# Patient Record
Sex: Female | Born: 1950 | Race: White | Hispanic: No | Smoking: Former smoker
Health system: Southern US, Community
[De-identification: ages and names within clinical notes are randomized; demographics above are authoritative.]

## PROBLEM LIST (undated history)

## (undated) DIAGNOSIS — H9190 Unspecified hearing loss, unspecified ear: Secondary | ICD-10-CM

## (undated) DIAGNOSIS — C801 Malignant (primary) neoplasm, unspecified: Secondary | ICD-10-CM

## (undated) DIAGNOSIS — Z9889 Other specified postprocedural states: Secondary | ICD-10-CM

## (undated) DIAGNOSIS — M1990 Unspecified osteoarthritis, unspecified site: Secondary | ICD-10-CM

## (undated) DIAGNOSIS — I10 Essential (primary) hypertension: Secondary | ICD-10-CM

## (undated) DIAGNOSIS — Z974 Presence of external hearing-aid: Secondary | ICD-10-CM

## (undated) DIAGNOSIS — R7303 Prediabetes: Secondary | ICD-10-CM

## (undated) DIAGNOSIS — B1910 Unspecified viral hepatitis B without hepatic coma: Secondary | ICD-10-CM

## (undated) DIAGNOSIS — R42 Dizziness and giddiness: Secondary | ICD-10-CM

## (undated) DIAGNOSIS — F411 Generalized anxiety disorder: Secondary | ICD-10-CM

## (undated) DIAGNOSIS — F419 Anxiety disorder, unspecified: Secondary | ICD-10-CM

## (undated) DIAGNOSIS — R112 Nausea with vomiting, unspecified: Secondary | ICD-10-CM

## (undated) DIAGNOSIS — G4730 Sleep apnea, unspecified: Secondary | ICD-10-CM

## (undated) HISTORY — PX: TONSILLECTOMY: SUR1361

## (undated) HISTORY — PX: BREAST EXCISIONAL BIOPSY: SUR124

## (undated) HISTORY — DX: Unspecified hearing loss, unspecified ear: H91.90

## (undated) HISTORY — PX: APPENDECTOMY: SHX54

## (undated) HISTORY — PX: ABDOMINAL HYSTERECTOMY: SHX81

## (undated) HISTORY — DX: Generalized anxiety disorder: F41.1

---

## 1970-01-02 DIAGNOSIS — B1910 Unspecified viral hepatitis B without hepatic coma: Secondary | ICD-10-CM

## 1970-01-02 DIAGNOSIS — Z8619 Personal history of other infectious and parasitic diseases: Secondary | ICD-10-CM

## 1970-01-02 HISTORY — DX: Personal history of other infectious and parasitic diseases: Z86.19

## 1970-01-02 HISTORY — DX: Unspecified viral hepatitis B without hepatic coma: B19.10

## 2004-02-11 ENCOUNTER — Ambulatory Visit: Payer: Self-pay | Admitting: Pain Medicine

## 2004-02-22 ENCOUNTER — Ambulatory Visit: Payer: Self-pay | Admitting: Pain Medicine

## 2004-03-24 ENCOUNTER — Ambulatory Visit: Payer: Self-pay | Admitting: Pain Medicine

## 2004-04-04 ENCOUNTER — Ambulatory Visit: Payer: Self-pay | Admitting: Pain Medicine

## 2004-05-05 ENCOUNTER — Ambulatory Visit: Payer: Self-pay | Admitting: Pain Medicine

## 2004-05-16 ENCOUNTER — Ambulatory Visit: Payer: Self-pay | Admitting: Pain Medicine

## 2004-05-18 ENCOUNTER — Ambulatory Visit: Payer: Self-pay

## 2004-06-06 ENCOUNTER — Ambulatory Visit: Payer: Self-pay | Admitting: Pain Medicine

## 2004-06-28 ENCOUNTER — Ambulatory Visit: Payer: Self-pay | Admitting: Pain Medicine

## 2004-07-06 ENCOUNTER — Ambulatory Visit: Payer: Self-pay | Admitting: Pain Medicine

## 2004-07-26 ENCOUNTER — Ambulatory Visit: Payer: Self-pay | Admitting: Pain Medicine

## 2004-08-11 ENCOUNTER — Ambulatory Visit: Payer: Self-pay | Admitting: Unknown Physician Specialty

## 2004-08-23 ENCOUNTER — Ambulatory Visit: Payer: Self-pay | Admitting: Pain Medicine

## 2005-05-22 ENCOUNTER — Ambulatory Visit: Payer: Self-pay

## 2005-09-20 ENCOUNTER — Ambulatory Visit: Payer: Self-pay | Admitting: Nurse Practitioner

## 2006-05-26 ENCOUNTER — Emergency Department: Payer: Self-pay | Admitting: Unknown Physician Specialty

## 2006-05-30 ENCOUNTER — Ambulatory Visit: Payer: Self-pay

## 2007-02-20 ENCOUNTER — Ambulatory Visit: Payer: Self-pay | Admitting: Internal Medicine

## 2007-06-03 ENCOUNTER — Ambulatory Visit: Payer: Self-pay

## 2007-08-21 ENCOUNTER — Ambulatory Visit: Payer: Self-pay | Admitting: Internal Medicine

## 2008-05-26 ENCOUNTER — Ambulatory Visit: Payer: Self-pay | Admitting: Gastroenterology

## 2008-06-04 ENCOUNTER — Ambulatory Visit: Payer: Self-pay

## 2009-02-26 ENCOUNTER — Ambulatory Visit: Payer: Self-pay | Admitting: Sports Medicine

## 2009-03-25 ENCOUNTER — Ambulatory Visit: Payer: Self-pay | Admitting: Unknown Physician Specialty

## 2009-03-29 ENCOUNTER — Ambulatory Visit: Payer: Self-pay | Admitting: Unknown Physician Specialty

## 2009-06-07 ENCOUNTER — Ambulatory Visit: Payer: Self-pay

## 2010-01-02 HISTORY — PX: WRIST SURGERY: SHX841

## 2010-06-10 ENCOUNTER — Ambulatory Visit: Payer: Self-pay

## 2011-04-06 ENCOUNTER — Ambulatory Visit: Payer: Self-pay | Admitting: Orthopedic Surgery

## 2011-06-12 ENCOUNTER — Ambulatory Visit: Payer: Self-pay

## 2012-06-12 ENCOUNTER — Ambulatory Visit: Payer: Self-pay

## 2013-01-02 HISTORY — PX: TOTAL HIP ARTHROPLASTY: SHX124

## 2013-05-05 DIAGNOSIS — M519 Unspecified thoracic, thoracolumbar and lumbosacral intervertebral disc disorder: Secondary | ICD-10-CM | POA: Insufficient documentation

## 2013-05-05 DIAGNOSIS — M5090 Cervical disc disorder, unspecified, unspecified cervical region: Secondary | ICD-10-CM

## 2013-05-05 HISTORY — DX: Unspecified thoracic, thoracolumbar and lumbosacral intervertebral disc disorder: M51.9

## 2013-05-05 HISTORY — DX: Cervical disc disorder, unspecified, unspecified cervical region: M50.90

## 2013-06-13 ENCOUNTER — Ambulatory Visit: Payer: Self-pay

## 2013-11-11 DIAGNOSIS — R739 Hyperglycemia, unspecified: Secondary | ICD-10-CM | POA: Insufficient documentation

## 2013-11-11 HISTORY — DX: Hyperglycemia, unspecified: R73.9

## 2014-02-04 ENCOUNTER — Ambulatory Visit: Payer: Self-pay | Admitting: Orthopedic Surgery

## 2014-02-04 LAB — APTT: Activated PTT: 30 secs (ref 23.6–35.9)

## 2014-02-04 LAB — URINALYSIS, COMPLETE
BLOOD: NEGATIVE
Bacteria: NONE SEEN
GLUCOSE, UR: NEGATIVE mg/dL (ref 0–75)
Ketone: NEGATIVE
Leukocyte Esterase: NEGATIVE
NITRITE: NEGATIVE
PROTEIN: NEGATIVE
Ph: 5 (ref 4.5–8.0)
RBC,UR: 1 /HPF (ref 0–5)
Specific Gravity: 1.017 (ref 1.003–1.030)
Squamous Epithelial: 1
WBC UR: 1 /HPF (ref 0–5)

## 2014-02-04 LAB — SEDIMENTATION RATE: Erythrocyte Sed Rate: 12 mm/hr (ref 0–30)

## 2014-02-04 LAB — BASIC METABOLIC PANEL
Anion Gap: 5 — ABNORMAL LOW (ref 7–16)
BUN: 14 mg/dL (ref 7–18)
CHLORIDE: 105 mmol/L (ref 98–107)
CREATININE: 0.64 mg/dL (ref 0.60–1.30)
Calcium, Total: 9.1 mg/dL (ref 8.5–10.1)
Co2: 32 mmol/L (ref 21–32)
EGFR (African American): >60
EGFR (Non-African Amer.): >60
Glucose: 111 mg/dL — ABNORMAL HIGH (ref 65–99)
Osmolality: 284 (ref 275–301)
POTASSIUM: 4 mmol/L (ref 3.5–5.1)
Sodium: 142 mmol/L (ref 136–145)

## 2014-02-04 LAB — CBC
HCT: 38.2 % (ref 35.0–47.0)
HGB: 12.9 g/dL (ref 12.0–16.0)
MCH: 31.9 pg (ref 26.0–34.0)
MCHC: 33.7 g/dL (ref 32.0–36.0)
MCV: 95 fL (ref 80–100)
Platelet: 207 10*3/uL (ref 150–440)
RBC: 4.04 10*6/uL (ref 3.80–5.20)
RDW: 13.7 % (ref 11.5–14.5)
WBC: 3.5 10*3/uL — AB (ref 3.6–11.0)

## 2014-02-04 LAB — PROTIME-INR
INR: 0.9
PROTHROMBIN TIME: 12.7 s

## 2014-02-04 LAB — MRSA PCR SCREENING

## 2014-02-05 LAB — URINE CULTURE

## 2014-02-17 ENCOUNTER — Inpatient Hospital Stay: Payer: Self-pay | Admitting: Orthopedic Surgery

## 2014-04-26 NOTE — Op Note (Signed)
PATIENT NAME:  Tammy Gould, Tammy Gould MR#:  [id number] DATE OF BIRTH:  05-17-1950  DATE OF PROCEDURE:  04/06/2011  PREOPERATIVE DIAGNOSIS: Right distal radius fracture, displaced.  POSTOPERATIVE DIAGNOSIS: Right distal radius fracture, displaced.   PROCEDURE: Open reduction internal fixation right distal radius.   ANESTHESIA: General.   SURGEON: Laurene Footman, MD   DESCRIPTION OF PROCEDURE: The patient was brought to the operating room and after adequate anesthesia was obtained, the right arm was prepped and draped in the usual sterile fashion with a tourniquet applied to the upper arm. After patient identification and time-out procedures were completed, the tourniquet was raised to 250 mmHg. A volar incision was made in line with the FCR tendon sheath. The incision was carried down to the tendon sheath and the tendon sheath incised and hemostasis achieved with electrocautery. Next, the flexor tendon was retracted to the ulnar side. The radial artery was protected on the radial side and the deep tendon sheath incised. The muscle was retracted to the ulnar side and a Charnley retractor placed. The pronator muscle was detached off its attachment at the radial styloid and proximal fragment to expose the fracture site. With traction and flexion, essentially anatomic reduction could be obtained on AP and lateral projections with the mini C-arm being used to assess this. A standard short DVR plate was then applied to the distal radius and care taken to try to get it in good position on the distal radius and adequate position on the shaft. After adequate position was obtained, K-wire was used to hold it in place. Two proximal screws were placed using standard technique, drilling, measuring, and placing self-tapping screw. At this point, the wrist was held in a flexed position and the distal peg holes were filled using standard technique, drilling, measuring, and placing the smooth pegs. Mini C-arm views showed  excellent alignment of components with essentially anatomic alignment. There did not appear to be pin penetration beyond the bone and the fracture was stable to range of motion. The wound was irrigated thoroughly and the tourniquet let down. Hemostasis was checked with electrocautery. 4-0 Vicryl was used subcutaneously followed by 4-0 nylon for the skin. Xeroform, 4 x 4's, Webril, and a volar splint were applied after infiltration of 10 mL of 0.5% Sensorcaine without epinephrine proximal to the incision to aid in postop analgesia. There were no complications.   SPECIMEN: None.   ESTIMATED BLOOD LOSS: Minimal.   TOURNIQUET TIME: 32 minutes at 250 mmHg.  IMPLANT: Biomet DVR volar locking plate with standard short plate, multiple pegs and screws.   ____________________________ Laurene Footman, MD mjm:drc D: 04/06/2011 19:50:24 ET T: 04/07/2011 08:10:22 ET JOB#: [id number]  cc: Laurene Footman, MD, <Dictator> Laurene Footman MD ELECTRONICALLY SIGNED 04/07/2011 8:23

## 2014-04-27 LAB — SURGICAL PATHOLOGY

## 2014-05-03 NOTE — Op Note (Signed)
PATIENT NAME:  Tammy Gould, Tammy Gould MR#:  [id number] DATE OF BIRTH:  June 26, 1950  DATE OF PROCEDURE:  02/17/2014  PREOPERATIVE DIAGNOSIS: Right hip osteoarthritis.   POSTOPERATIVE DIAGNOSIS: Right hip osteoarthritis  PROCEDURE: Right total hip replacement.   ANESTHESIA: Spinal.   SURGEON: Laurene Footman, MD   DESCRIPTION OF PROCEDURE: The patient was brought to the operating room and after adequate spinal anesthesia was obtained, the patient was placed on the operative table with the right foot in the Medacta attachment, left foot on a well-padded table. After bringing the C-arm in and getting good visualization of the hip, appropriate patient identification and timeout procedures were completed after prepping and draping. A direct anterior approach was made with incision centered over the greater trochanter and the tensor fascia muscle. The incision was carried down through the skin and subcutaneous tissue. The tensor fascia was incised and the muscle retracted laterally. Deep fascia was incised and the lateral femoral circumflex vessels were identified and ligated. The rectus was retracted medially and the anterior capsulotomy was performed with femoral neck cut then carried out. The head was removed and was noted to have essentially complete loss of articular cartilage. The acetabulum also had complete loss of articular cartilage. The acetabulum was reamed to 52 mm, at which point there was good bleeding bone and the 52 mm trial fit well. This was impacted into place. The leg was externally rotated and the leg dropped into extension after pubofemoral and ischiofemoral releases. Broaching was carried out to a size 4, which seemed quite to fit quite well and an S head gave restoration of length. The final components were assembled. The 4 stem was impacted down the canal with the S-28 head and the dual mobility liner for the 52 mm Versafitcup DM impacted onto the stem. The hip was reduced and was stable. The  wound was thoroughly irrigated with Betadine solution. Local anesthetic, 30 mL of 0.25% Sensorcaine with epinephrine, infiltrated. The deep fascia repaired using a heavy quill. Subcutaneous drain placed, 2-0 quill for the subcutaneous tissue followed by skin staples. Xeroform, 4 x 4's, ABD, and tape applied. The patient was sent to the recovery room in stable condition.   ESTIMATED BLOOD LOSS: 500.   COMPLICATIONS: None.   SPECIMENS: Femoral head.   IMPLANTS: Medacta Versafitcup DM 52 mm with liner, 4 standard Amis stem and an S-28 mm head.    ____________________________ Laurene Footman, MD mjm:bm D: 02/17/2014 19:17:54 ET T: 02/18/2014 01:12:58 ET JOB#: [id number]  cc: Laurene Footman, MD, <Dictator> Laurene Footman MD ELECTRONICALLY SIGNED 02/18/2014 8:33

## 2014-05-03 NOTE — Discharge Summary (Signed)
PATIENT NAME:  Tammy Gould, Tammy Gould MR#:  [id number] DATE OF BIRTH:  [birth year]  DATE OF ADMISSION:  02/17/2014 DATE OF DISCHARGE:  02/20/2014  ADMITTING DIAGNOSIS: Right hip osteoarthritis.   DISCHARGE DIAGNOSIS: Right hip osteoarthritis.   PROCEDURE: Right total hip replacement.   ANESTHESIA: Spinal.   SURGEON: Laurene Footman, M.D.   ESTIMATED BLOOD LOSS: 500 mL.  COMPLICATIONS: None.   SPECIMEN: Femoral head.   IMPLANTS: Medacta Versafit DM 52 mm with liner, 4 standard Amis stem and a S28 mm head.    HISTORY: The patient is a 64 year old female who has had prorgressive right hip pain that has progressed to the point that it interferes with activities of daily living. She has failed non operative treatment options.  PHYSICAL EXAMINATION: EYES: Pupils equal, round and reactive to light.  HEAD: Normocephalic, atraumatic.  HEART: Regular rate and rhythm.  RESPIRATORY: Clear and nonlabored breathing.  RIGHT LOWER EXTREMITY: Examination of the right lower extremity shows the patient is neurovascularly intact. She has no swelling, warmth or erythema. She has normal range of motion of the knee and ankle. She has limited internal rotation.   HOSPITAL COURSE: The patient was admitted to the hospital on February 17, 2014. She had surgery that same day and was brought to the orthopedic floor from the PACU in stable condition. On postoperative day 1, the patient had acute postoperative blood loss anemia with hemoglobin of 11.1. Other labs and vital signs were stable. She made good progress with physical therapy on postoperative day 1. She continued to progress that on postoperative day 2 and postoperative day 3. By postoperative day 3, the patient was doing well, pain was under control, she was tolerating p.o., and she was stable and ready for discharge to home health physical therapy.   CONDITION AT DISCHARGE: Stable.   DISCHARGE INSTRUCTIONS: The patient may gradually increase weight-bearing on  the effected extremity. Elevate the effected foot or leg on 1 or 2 pillows. Thigh high TED hose on both legs and remove 1 hour per 8 hour shift. Elevate the heels off the bed. Incentive spirometer every hour while awake. Encourage cough and deep breathing. The patient may resume a regular diet as tolerated. Apply an ice pack to the effected area. Do not get the dressing or bandage wet or dirty. Call [facility] orthopedics if you experience any increased leg pain. Call [facility] orthopedics for any bright red bleeding from the incision wound, fever above 101.5 degrees, redness, swelling, or drainage at the incision. Call [facility] orthopedics if you experience any increased leg pain, numbness or weakness in your legs or bowel or bladder symptoms. Physical therapy has been arranged for continuation of protocol. Hopefully this will be home health physical therapy. The patient will follow up with [facility] orthopedics in 2 weeks.   DISCHARGE MEDICATIONS: Please see list of discharge medications on discharge instructions.   ____________________________ T. Rachelle Hora, PA-C tcg:sb D: 02/20/2014 07:15:00 ET T: 02/20/2014 07:32:28 ET JOB#: [id number]  cc: T. Rachelle Hora, PA-C, <Dictator> Duanne Guess [place] ELECTRONICALLY SIGNED 02/24/2014 12:21

## 2014-05-12 DIAGNOSIS — G4733 Obstructive sleep apnea (adult) (pediatric): Secondary | ICD-10-CM

## 2014-05-12 HISTORY — DX: Obstructive sleep apnea (adult) (pediatric): G47.33

## 2014-05-18 ENCOUNTER — Other Ambulatory Visit: Payer: Self-pay | Admitting: Obstetrics and Gynecology

## 2014-05-18 DIAGNOSIS — Z1231 Encounter for screening mammogram for malignant neoplasm of breast: Secondary | ICD-10-CM

## 2014-06-15 ENCOUNTER — Other Ambulatory Visit: Payer: Self-pay | Admitting: Obstetrics and Gynecology

## 2014-06-15 ENCOUNTER — Ambulatory Visit
Admission: RE | Admit: 2014-06-15 | Discharge: 2014-06-15 | Disposition: A | Discharge status: Home / Self Care | Payer: Medicare Other | Source: Ambulatory Visit | Attending: Obstetrics and Gynecology | Admitting: Obstetrics and Gynecology

## 2014-06-15 DIAGNOSIS — Z1231 Encounter for screening mammogram for malignant neoplasm of breast: Secondary | ICD-10-CM | POA: Insufficient documentation

## 2015-04-15 ENCOUNTER — Other Ambulatory Visit: Payer: Self-pay | Admitting: Obstetrics and Gynecology

## 2015-04-15 DIAGNOSIS — Z1231 Encounter for screening mammogram for malignant neoplasm of breast: Secondary | ICD-10-CM

## 2015-05-24 DIAGNOSIS — E782 Mixed hyperlipidemia: Secondary | ICD-10-CM

## 2015-05-24 DIAGNOSIS — F329 Major depressive disorder, single episode, unspecified: Secondary | ICD-10-CM

## 2015-05-24 HISTORY — DX: Mixed hyperlipidemia: E78.2

## 2015-05-24 HISTORY — DX: Major depressive disorder, single episode, unspecified: F32.9

## 2015-06-01 ENCOUNTER — Encounter: Payer: Self-pay | Admitting: *Deleted

## 2015-06-01 ENCOUNTER — Ambulatory Visit
Admission: EM | Admit: 2015-06-01 | Discharge: 2015-06-01 | Disposition: A | Discharge status: Home / Self Care | Payer: Federal, State, Local not specified - PPO | Attending: Family Medicine | Admitting: Family Medicine

## 2015-06-01 ENCOUNTER — Ambulatory Visit: Payer: Federal, State, Local not specified - PPO

## 2015-06-01 DIAGNOSIS — Z880 Allergy status to penicillin: Secondary | ICD-10-CM | POA: Diagnosis not present

## 2015-06-01 DIAGNOSIS — S134XXA Sprain of ligaments of cervical spine, initial encounter: Secondary | ICD-10-CM | POA: Insufficient documentation

## 2015-06-01 DIAGNOSIS — Z803 Family history of malignant neoplasm of breast: Secondary | ICD-10-CM | POA: Insufficient documentation

## 2015-06-01 DIAGNOSIS — Z87891 Personal history of nicotine dependence: Secondary | ICD-10-CM | POA: Insufficient documentation

## 2015-06-01 DIAGNOSIS — M6248 Contracture of muscle, other site: Secondary | ICD-10-CM

## 2015-06-01 DIAGNOSIS — M62838 Other muscle spasm: Secondary | ICD-10-CM | POA: Diagnosis not present

## 2015-06-01 DIAGNOSIS — M542 Cervicalgia: Secondary | ICD-10-CM | POA: Diagnosis present

## 2015-06-01 MED ORDER — ORPHENADRINE CITRATE ER 100 MG PO TB12: 100.0000 mg | ORAL_TABLET | Freq: Two times a day (BID) | ORAL | Status: DC | PRN

## 2015-06-01 MED ORDER — MELOXICAM 15 MG PO TABS: 15.0000 mg | ORAL_TABLET | Freq: Every day | ORAL | Status: DC
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
## 2015-06-16 ENCOUNTER — Other Ambulatory Visit: Payer: Self-pay | Admitting: Obstetrics and Gynecology

## 2015-06-16 ENCOUNTER — Ambulatory Visit
Admission: RE | Admit: 2015-06-16 | Discharge: 2015-06-16 | Disposition: A | Discharge status: Home / Self Care | Payer: Medicare Other | Source: Ambulatory Visit | Attending: Obstetrics and Gynecology | Admitting: Obstetrics and Gynecology

## 2015-06-16 DIAGNOSIS — Z1231 Encounter for screening mammogram for malignant neoplasm of breast: Secondary | ICD-10-CM | POA: Diagnosis present

## 2016-05-04 ENCOUNTER — Other Ambulatory Visit: Payer: Self-pay | Admitting: Obstetrics and Gynecology

## 2016-05-04 DIAGNOSIS — Z1231 Encounter for screening mammogram for malignant neoplasm of breast: Secondary | ICD-10-CM

## 2016-06-01 ENCOUNTER — Other Ambulatory Visit: Payer: Self-pay | Admitting: Obstetrics and Gynecology

## 2016-06-01 DIAGNOSIS — Z1231 Encounter for screening mammogram for malignant neoplasm of breast: Secondary | ICD-10-CM

## 2016-06-20 ENCOUNTER — Ambulatory Visit
Admission: RE | Admit: 2016-06-20 | Discharge: 2016-06-20 | Disposition: A | Discharge status: Home / Self Care | Payer: Medicare Other | Source: Ambulatory Visit | Attending: Obstetrics and Gynecology | Admitting: Obstetrics and Gynecology

## 2016-06-20 DIAGNOSIS — Z1231 Encounter for screening mammogram for malignant neoplasm of breast: Secondary | ICD-10-CM | POA: Diagnosis not present

## 2016-07-19 ENCOUNTER — Inpatient Hospital Stay
Admission: EM | Admit: 2016-07-19 | Discharge: 2016-07-23 | DRG: 743 | Disposition: A | Discharge status: Home / Self Care | Payer: Medicare Other | Attending: Obstetrics and Gynecology | Admitting: Obstetrics and Gynecology

## 2016-07-19 ENCOUNTER — Emergency Department: Payer: Medicare Other

## 2016-07-19 ENCOUNTER — Encounter: Payer: Self-pay | Admitting: Emergency Medicine

## 2016-07-19 DIAGNOSIS — D259 Leiomyoma of uterus, unspecified: Secondary | ICD-10-CM | POA: Diagnosis present

## 2016-07-19 DIAGNOSIS — R102 Pelvic and perineal pain: Secondary | ICD-10-CM | POA: Diagnosis present

## 2016-07-19 DIAGNOSIS — N920 Excessive and frequent menstruation with regular cycle: Secondary | ICD-10-CM | POA: Diagnosis present

## 2016-07-19 DIAGNOSIS — N838 Other noninflammatory disorders of ovary, fallopian tube and broad ligament: Secondary | ICD-10-CM | POA: Diagnosis present

## 2016-07-19 DIAGNOSIS — R1031 Right lower quadrant pain: Secondary | ICD-10-CM

## 2016-07-19 DIAGNOSIS — N83511 Torsion of right ovary and ovarian pedicle: Principal | ICD-10-CM | POA: Diagnosis present

## 2016-07-19 DIAGNOSIS — Z87891 Personal history of nicotine dependence: Secondary | ICD-10-CM

## 2016-07-19 DIAGNOSIS — N83209 Unspecified ovarian cyst, unspecified side: Secondary | ICD-10-CM

## 2016-07-19 HISTORY — DX: Unspecified viral hepatitis B without hepatic coma: B19.10

## 2016-07-19 HISTORY — DX: Anxiety disorder, unspecified: F41.9

## 2016-07-19 HISTORY — DX: Sleep apnea, unspecified: G47.30

## 2016-07-19 HISTORY — DX: Unspecified osteoarthritis, unspecified site: M19.90

## 2016-07-19 HISTORY — DX: Other noninflammatory disorders of ovary, fallopian tube and broad ligament: N83.8

## 2016-07-19 HISTORY — DX: Prediabetes: R73.03

## 2016-07-19 LAB — COMPREHENSIVE METABOLIC PANEL
ALT: 18 U/L (ref 14–54)
AST: 23 U/L (ref 15–41)
Albumin: 4.9 g/dL (ref 3.5–5.0)
Alkaline Phosphatase: 63 U/L (ref 38–126)
Anion gap: 12 (ref 5–15)
BILIRUBIN TOTAL: 1 mg/dL (ref 0.3–1.2)
BUN: 10 mg/dL (ref 6–20)
CO2: 27 mmol/L (ref 22–32)
Calcium: 9.6 mg/dL (ref 8.9–10.3)
Chloride: 102 mmol/L (ref 101–111)
Creatinine, Ser: 0.55 mg/dL (ref 0.44–1.00)
GFR calc non Af Amer: >60 mL/min (ref >60)
Glucose, Bld: 155 mg/dL — ABNORMAL HIGH (ref 65–99)
POTASSIUM: 3.8 mmol/L (ref 3.5–5.1)
Sodium: 141 mmol/L (ref 135–145)
TOTAL PROTEIN: 8 g/dL (ref 6.5–8.1)

## 2016-07-19 LAB — CBC
HCT: 42.2 % (ref 35.0–47.0)
HEMOGLOBIN: 14.5 g/dL (ref 12.0–16.0)
MCH: 31.8 pg (ref 26.0–34.0)
MCHC: 34.5 g/dL (ref 32.0–36.0)
MCV: 92.3 fL (ref 80.0–100.0)
Platelets: 219 10*3/uL (ref 150–440)
RBC: 4.57 MIL/uL (ref 3.80–5.20)
RDW: 13.8 % (ref 11.5–14.5)
WBC: 7.2 10*3/uL (ref 3.6–11.0)

## 2016-07-19 LAB — URINALYSIS, COMPLETE (UACMP) WITH MICROSCOPIC
BILIRUBIN URINE: NEGATIVE
Bacteria, UA: NONE SEEN
Glucose, UA: 50 mg/dL — AB
Hgb urine dipstick: NEGATIVE
KETONES UR: 20 mg/dL — AB
LEUKOCYTES UA: NEGATIVE
NITRITE: NEGATIVE
PH: 7 (ref 5.0–8.0)
PROTEIN: 30 mg/dL — AB
Specific Gravity, Urine: 1.012 (ref 1.005–1.030)

## 2016-07-19 LAB — LIPASE, BLOOD: LIPASE: 26 U/L (ref 11–51)

## 2016-07-19 MED ORDER — ONDANSETRON HCL 4 MG/2ML IJ SOLN
4.0000 mg | Freq: Once | INTRAMUSCULAR | Status: AC
  Administered 2016-07-19: 4 mg via INTRAVENOUS
  Filled 2016-07-19: qty 2

## 2016-07-19 MED ORDER — IOPAMIDOL (ISOVUE-300) INJECTION 61%
100.0000 mL | Freq: Once | INTRAVENOUS | Status: AC | PRN
  Administered 2016-07-19: 100 mL via INTRAVENOUS
  Filled 2016-07-19: qty 100

## 2016-07-19 MED ORDER — ONDANSETRON HCL 4 MG/2ML IJ SOLN
4.0000 mg | Freq: Four times a day (QID) | INTRAMUSCULAR | Status: DC | PRN
  Administered 2016-07-20: 4 mg via INTRAVENOUS
  Filled 2016-07-19: qty 2

## 2016-07-19 MED ORDER — LABETALOL HCL 5 MG/ML IV SOLN
20.0000 mg | INTRAVENOUS | Status: DC | PRN
  Filled 2016-07-19: qty 4

## 2016-07-19 MED ORDER — ONDANSETRON HCL 4 MG/2ML IJ SOLN: 4.0000 mg | Freq: Four times a day (QID) | INTRAMUSCULAR | Status: DC | PRN

## 2016-07-19 MED ORDER — SODIUM CHLORIDE 0.9% FLUSH: 9.0000 mL | INTRAVENOUS | Status: DC | PRN

## 2016-07-19 MED ORDER — SODIUM CHLORIDE 0.9 % IV BOLUS (SEPSIS)
1000.0000 mL | Freq: Once | INTRAVENOUS | Status: AC
  Administered 2016-07-19: 1000 mL via INTRAVENOUS

## 2016-07-19 MED ORDER — NALOXONE HCL 0.4 MG/ML IJ SOLN: 0.4000 mg | INTRAMUSCULAR | Status: DC | PRN

## 2016-07-19 MED ORDER — MORPHINE SULFATE 2 MG/ML IV SOLN: INTRAVENOUS | Status: DC

## 2016-07-19 MED ORDER — HYDROMORPHONE HCL 1 MG/ML IJ SOLN
1.0000 mg | Freq: Once | INTRAMUSCULAR | Status: AC
  Administered 2016-07-19: 1 mg via INTRAVENOUS
  Filled 2016-07-19: qty 1

## 2016-07-19 MED ORDER — PRENATAL MULTIVITAMIN CH
1.0000 | ORAL_TABLET | Freq: Every day | ORAL | Status: DC
  Administered 2016-07-22: 1 via ORAL

## 2016-07-19 MED ORDER — ONDANSETRON HCL 4 MG PO TABS: 4.0000 mg | ORAL_TABLET | Freq: Four times a day (QID) | ORAL | Status: DC | PRN

## 2016-07-19 MED ORDER — DIPHENHYDRAMINE HCL 50 MG/ML IJ SOLN: 12.5000 mg | Freq: Four times a day (QID) | INTRAMUSCULAR | Status: DC | PRN

## 2016-07-19 MED ORDER — DIPHENHYDRAMINE HCL 12.5 MG/5ML PO ELIX: 12.5000 mg | ORAL_SOLUTION | Freq: Four times a day (QID) | ORAL | Status: DC | PRN

## 2016-07-19 MED ORDER — LACTATED RINGERS IV SOLN
INTRAVENOUS | Status: DC
  Administered 2016-07-20: 1 mL via INTRAVENOUS
  Administered 2016-07-20 – 2016-07-21 (×4): via INTRAVENOUS
  Administered 2016-07-21: 125 mL/h via INTRAVENOUS

## 2016-07-19 NOTE — ED Notes (Signed)
Pt taken to CT.

## 2016-07-19 NOTE — H&P (Signed)
Tammy Gould is an 66 y.o. female. G2 P2  Presents to [facility] ED tonigt with a 3 day h/o of lower abd pain and LBP . Episodes on emesis daily x 3 days . Marland Kitchen CT SCAN shows a 24 x 13 x13 cm complex left ovarian cyst with thick septations extending up to the transverse colon . Also a possible 5x4x4 cm complex cystic lesion in the PCD. Dr Leafy Ro examined the pt in Apr 2018 .   Pertinent Gynecological History: Menses: post-menopausal Bleeding: n/a Contraception: post menopausal status DES exposure: denies Blood transfusions: none Sexually transmitted diseases: Hep B  Previous GYN Procedures: breast bx  Last mammogram: normal Date: 2017 Last pap: normal Date: 4/18 OB History: G2, P2   Menstrual History: Menarche age:  No LMP recorded. Patient is postmenopausal.    Past Medical History:  Diagnosis Date  . Arthritis   . Prediabetes   Lumbar disc disease  Depression  Diverticulosis Hep B Sleep apnea   Past Surgical History:  Procedure Laterality Date  . APPENDECTOMY    . BREAST EXCISIONAL BIOPSY Bilateral 1969   negative  . TONSILLECTOMY    . TOTAL HIP ARTHROPLASTY    . WRIST SURGERY      Family History  Problem Relation Age of Onset  . Breast cancer Paternal Grandmother 63    Social History:  reports that she has quit smoking. She has never used smokeless tobacco. She reports that she does not drink alcohol or use drugs. rare wine   Allergies:  Allergies  Allergen Reactions  . Penicillins Hives     (Not in a hospital admission)  ROS  General : no recent weight loss Eyes: no vision loss Ears: no hearing loss Throat: no dysphagia Endocrine: no thyroid dysfunction  Pulm : no asthma , no SOB  CV : no chest pain no MI  Urinary : +intermittent incontinence GI: + recent nausea and emesis, + hepatitis B( remote) GYN : no pelvic infections  M/S : no weakness Neurologic: no Seizure activity  Hematologic no coagulopathy  Blood pressure (!) 188/90, pulse 62,  temperature 97.6 F (36.4 C), temperature source Oral, resp. rate 18, height 5\' 5"  (1.651 m), weight 79.8 kg (176 lb), SpO2 98 %. Physical Exam   HEENT PERRLA EOMI  CN intact  Lungs CTA , no rales  CV RRR without murmur ADB: + bs +large mass fills abdomen , extends to xiphoid Mild TTP , no rebound TTP Pelvic deferred  M/S : strength 5+/5   DTR 3+/5 Results for orders placed or performed during the hospital encounter of 07/19/16 (from the past 24 hour(s))  Lipase, blood     Status: None   Collection Time: 07/19/16  5:45 PM  Result Value Ref Range   Lipase 26 11 - 51 U/L  Comprehensive metabolic panel     Status: Abnormal   Collection Time: 07/19/16  5:45 PM  Result Value Ref Range   Sodium 141 135 - 145 mmol/L   Potassium 3.8 3.5 - 5.1 mmol/L   Chloride 102 101 - 111 mmol/L   CO2 27 22 - 32 mmol/L   Glucose, Bld 155 (H) 65 - 99 mg/dL   BUN 10 6 - 20 mg/dL   Creatinine, Ser 0.55 0.44 - 1.00 mg/dL   Calcium 9.6 8.9 - 10.3 mg/dL   Total Protein 8.0 6.5 - 8.1 g/dL   Albumin 4.9 3.5 - 5.0 g/dL   AST 23 15 - 41 U/L   ALT 18 14 -  54 U/L   Alkaline Phosphatase 63 38 - 126 U/L   Total Bilirubin 1.0 0.3 - 1.2 mg/dL   GFR calc non Af Amer >60 >60 mL/min   GFR calc Af Amer >60 >60 mL/min   Anion gap 12 5 - 15  CBC     Status: None   Collection Time: 07/19/16  5:45 PM  Result Value Ref Range   WBC 7.2 3.6 - 11.0 K/uL   RBC 4.57 3.80 - 5.20 MIL/uL   Hemoglobin 14.5 12.0 - 16.0 g/dL   HCT 42.2 35.0 - 47.0 %   MCV 92.3 80.0 - 100.0 fL   MCH 31.8 26.0 - 34.0 pg   MCHC 34.5 32.0 - 36.0 g/dL   RDW 13.8 11.5 - 14.5 %   Platelets 219 150 - 440 K/uL  Urinalysis, Complete w Microscopic     Status: Abnormal   Collection Time: 07/19/16  5:45 PM  Result Value Ref Range   Color, Urine YELLOW (A) YELLOW   APPearance CLEAR (A) CLEAR   Specific Gravity, Urine 1.012 1.005 - 1.030   pH 7.0 5.0 - 8.0   Glucose, UA 50 (A) NEGATIVE mg/dL   Hgb urine dipstick NEGATIVE NEGATIVE   Bilirubin Urine  NEGATIVE NEGATIVE   Ketones, ur 20 (A) NEGATIVE mg/dL   Protein, ur 30 (A) NEGATIVE mg/dL   Nitrite NEGATIVE NEGATIVE   Leukocytes, UA NEGATIVE NEGATIVE   RBC / HPF 0-5 0 - 5 RBC/hpf   WBC, UA 0-5 0 - 5 WBC/hpf   Bacteria, UA NONE SEEN NONE SEEN   Squamous Epithelial / LPF 0-5 (A) NONE SEEN    Ct Abdomen Pelvis W Contrast  Addendum Date: 07/19/2016   ADDENDUM REPORT: 07/19/2016 21:51 ADDENDUM: The original report was by Dr. Van Clines. The following addendum is by Dr. Van Clines: Critical Value/emergent results were called by telephone at the time of interpretation on 07/19/2016 at 9:37 pm to Dr. Carrie Mew , who verbally acknowledged these results. This is considered a critical value due to the possible ovarian torsion, in this case due to the very large adnexal mass. Electronically Signed   By: Van Clines M.D.   On: 07/19/2016 21:51   Result Date: 07/19/2016 CLINICAL DATA:  Right lower quadrant abdominal pain for 2 days. EXAM: CT ABDOMEN AND PELVIS WITH CONTRAST TECHNIQUE: Multidetector CT imaging of the abdomen and pelvis was performed using the standard protocol following bolus administration of intravenous contrast. CONTRAST:  18mL ISOVUE-300 IOPAMIDOL (ISOVUE-300) INJECTION 61% COMPARISON:  Pelvic ultrasound from 02/20/2007 FINDINGS: Lower chest: Bochdalek hernia containing adipose tissue. Hepatobiliary: There are approximately 6 small hypodense lesions in the liver, technically too small to characterize although statistically likely to be cysts or small hemangiomas. The 2 lesions in segment 6 remain hypodense on delayed images and accordingly are probably small cysts. Small density dependently in the gallbladder on image 25/ 2 is probably a small gallstone. No biliary dilatation. Pancreas: 0.9 cm hypodense lesion posteriorly in the pancreatic body on image 20/2, uncertain connectivity to the dorsal pancreatic duct. Spleen: Unremarkable Adrenals/Urinary Tract: Mild  left hydronephrosis terminating at the UPJ, but no delayed excretion or delayed nephrogram. Normal caliber ureter. Adrenal glands normal. Stomach/Bowel: Sigmoid colon diverticulosis. Vascular/Lymphatic: Aortoiliac atherosclerotic vascular disease. Reproductive: Apparently associated with the left adnexa and with face oral appearance along the adnexa, there is a 24.1 by 13.2 by 13.6 cm (volume = 2270 cm^3) multi cystic mass with thick septations and possible soft tissue component inferiorly. This extends up  just below the transverse colon in the upper abdomen. The lesion abuts and displaces bowel. There also appears to be a complex cystic lesion in the cul-de-sac potentially with some associated free fluid, this lesion measures 5.3 by 4.2 by 4.4 cm (volume = 51 cm^3) and could represent another deposit of tumor. This raises the possibility of early peritoneal spread. Unfortunately, pelvic findings are obscured by the streak artifact from the patient's right hip prosthesis. The uterus is mildly indistinct and grossly unremarkable. Other: No supplemental non-categorized findings. Musculoskeletal: Grade 1 degenerative anterolisthesis at L4-5, with right foraminal impingement due to disc uncovering and facet arthropathy. IMPRESSION: 1. Very large multicystic mass extending cephalad from the left adnexa, the mass measures up to 2270 cubic cm and extends up from the pelvis essentially to the level of the transverse colon. There is a high likelihood that this represents ovarian cancer. Correlate with tumor marker levels such as CA-125. Gynecologic-oncologic referral recommended. 2. In addition to the dominant tumor mass extending cephalad, there appears to be a tumor mass in the cul-de-sac which could represent some early peritoneal spread of tumor. Unfortunately the region is somewhat obscured by streak artifact from the patient's right hip implant. 3. On parasagittal images, there is a swirled appearance at the base of  the tumor, potentially reflecting torsion of the mass, which I speculate may be contributing to the patient's lower abdominal pain. 4. Hypodense liver lesions are probably benign but merits surveillance. 5. Bochdalek's hernia containing adipose tissue. 6. Mild left hydronephrosis terminating at the left UPJ, but without delayed excretion or delayed nephrogram, query chronic or congenital UPJ narrowing. 7. Sigmoid colon diverticulosis. 8. Mild right foraminal impingement L4-5 related to anterolisthesis and facet arthropathy. Radiology assistant personnel have been notified to put me in telephone contact with the referring physician or the referring physician's clinical representative in order to discuss these findings. Once this communication is established I will issue an addendum to this report for documentation purposes. Electronically Signed: By: Van Clines M.D. On: 07/19/2016 21:35    Assessment: Large complex ovarian mass filling abdomen is concerning for mucinous cystadenoma vs ovarian cancer Clinically stable with minimal pain  Plan :admit   CA125 level PCA overnight  IVF  NPO status and will consult GYN ONC tomorrow  She is aware the need for surgery to remove, debulk and diagnose  Gwen Her Daryan Buell 07/19/2016, 10:30 PM

## 2016-07-19 NOTE — ED Triage Notes (Signed)
Pt c/o RLQ abdominal pain for 2 days with vomiting.  No diarrhea or known fevers.  Denies urinary sx.  Denies hematuria.  Skin warm and dry.  Respirations unlabored. No vomiting at this time.  VSS.

## 2016-07-19 NOTE — ED Notes (Signed)
Dr. Stafford at bedside.  

## 2016-07-19 NOTE — ED Provider Notes (Addendum)
[facility] Emergency Department Provider Note  ____________________________________________  Time seen: Approximately 8:40 PM  I have reviewed the triage vital signs and the nursing notes.   HISTORY  Chief Complaint Abdominal Pain    HPI Tammy Gould is a 66 y.o. female who complains of worsening right lower quadrant abdominal pain for the past 2 days with 3 episodes of vomiting over the last 24 hours. No constipation or diarrhea. No urinary symptoms. Has chills and sweats but no fevers. Pain radiates into the right lower back. No aggravating or alleviating factors, moderate to severe in intensity and sharp.     Past Medical History:  Diagnosis Date  . Arthritis   . Prediabetes      There are no active problems to display for this patient.    Past Surgical History:  Procedure Laterality Date  . APPENDECTOMY    . BREAST EXCISIONAL BIOPSY Bilateral 1969   negative  . TONSILLECTOMY    . TOTAL HIP ARTHROPLASTY    . WRIST SURGERY       Prior to Admission medications   Medication Sig Start Date End Date Taking? Authorizing Provider  DULoxetine (CYMBALTA) 60 MG capsule Take 60 mg by mouth daily.    [provider]     Allergies Penicillins   Family History  Problem Relation Age of Onset  . Breast cancer Paternal Grandmother 44    Social History Social History  Substance Use Topics  . Smoking status: Former Research scientist (life sciences)  . Smokeless tobacco: Never Used  . Alcohol use No    Review of Systems  Constitutional:   No fever Positive chills.  ENT:   No sore throat. No rhinorrhea. Cardiovascular:   No chest pain or syncope. Respiratory:   No dyspnea or cough. Gastrointestinal:   Positive as above for abdominal pain with vomiting. No diarrhea.  Musculoskeletal:   Negative for focal pain or swelling All other systems reviewed and are negative except as documented above in ROS and  HPI.  ____________________________________________   PHYSICAL EXAM:  VITAL SIGNS: ED Triage Vitals [07/19/16 1745]  Enc Vitals Group     BP (!) 188/90     Pulse Rate 62     Resp 18     Temp 97.6 F (36.4 C)     Temp Source Oral     SpO2 98 %     Weight 176 lb (79.8 kg)     Height 5\' 5"  (1.651 m)     Head Circumference      Peak Flow      Pain Score 7     Pain Loc      Pain Edu?      Excl. in [place]?     Vital signs reviewed, nursing assessments reviewed.   Constitutional:   Alert and oriented. Well appearing and in no distress. Eyes:   No scleral icterus.  EOMI. No nystagmus. No conjunctival pallor. PERRL. ENT   Head:   Normocephalic and atraumatic.   Nose:   No congestion/rhinnorhea.    Mouth/Throat:   Dry mucous membranes, no pharyngeal erythema. No peritonsillar mass.    Neck:   No meningismus. Full ROM Hematological/Lymphatic/Immunilogical:   No cervical lymphadenopathy. Cardiovascular:   RRR. Symmetric bilateral radial and DP pulses.  No murmurs.  Respiratory:   Normal respiratory effort without tachypnea/retractions. Breath sounds are clear and equal bilaterally. No wheezes/rales/rhonchi. Gastrointestinal:   Soft With right lower quadrant tenderness. Diffusely guarding. Non distended. There is no CVA tenderness.  No rebound or rigidity. Genitourinary:   deferred Musculoskeletal:   Normal range of motion in all extremities. No joint effusions.  No lower extremity tenderness.  No edema. Neurologic:   Normal speech and language.  Motor grossly intact. No gross focal neurologic deficits are appreciated.  Skin:    Skin is warm, dry and intact. No rash noted.  No petechiae, purpura, or bullae.  ____________________________________________    LABS (pertinent positives/negatives) (all labs ordered are listed, but only abnormal results are displayed) Labs Reviewed  COMPREHENSIVE METABOLIC PANEL - Abnormal; Notable for the following:       Result Value    Glucose, Bld 155 (*)    All other components within normal limits  URINALYSIS, COMPLETE (UACMP) WITH MICROSCOPIC - Abnormal; Notable for the following:    Color, Urine YELLOW (*)    APPearance CLEAR (*)    Glucose, UA 50 (*)    Ketones, ur 20 (*)    Protein, ur 30 (*)    Squamous Epithelial / LPF 0-5 (*)    All other components within normal limits  LIPASE, BLOOD  CBC   ____________________________________________   EKG    ____________________________________________    RADIOLOGY  Ct Abdomen Pelvis W Contrast  Addendum Date: 07/19/2016   ADDENDUM REPORT: 07/19/2016 21:51 ADDENDUM: The original report was by Dr. Van Clines. The following addendum is by Dr. Van Clines: Critical Value/emergent results were called by telephone at the time of interpretation on 07/19/2016 at 9:37 pm to Dr. Carrie Mew , who verbally acknowledged these results. This is considered a critical value due to the possible ovarian torsion, in this case due to the very large adnexal mass. Electronically Signed   By: Van Clines M.D.   On: 07/19/2016 21:51   Result Date: 07/19/2016 CLINICAL DATA:  Right lower quadrant abdominal pain for 2 days. EXAM: CT ABDOMEN AND PELVIS WITH CONTRAST TECHNIQUE: Multidetector CT imaging of the abdomen and pelvis was performed using the standard protocol following bolus administration of intravenous contrast. CONTRAST:  17mL ISOVUE-300 IOPAMIDOL (ISOVUE-300) INJECTION 61% COMPARISON:  Pelvic ultrasound from 02/20/2007 FINDINGS: Lower chest: Bochdalek hernia containing adipose tissue. Hepatobiliary: There are approximately 6 small hypodense lesions in the liver, technically too small to characterize although statistically likely to be cysts or small hemangiomas. The 2 lesions in segment 6 remain hypodense on delayed images and accordingly are probably small cysts. Small density dependently in the gallbladder on image 25/ 2 is probably a small gallstone. No  biliary dilatation. Pancreas: 0.9 cm hypodense lesion posteriorly in the pancreatic body on image 29/9, uncertain connectivity to the dorsal pancreatic duct. Spleen: Unremarkable Adrenals/Urinary Tract: Mild left hydronephrosis terminating at the UPJ, but no delayed excretion or delayed nephrogram. Normal caliber ureter. Adrenal glands normal. Stomach/Bowel: Sigmoid colon diverticulosis. Vascular/Lymphatic: Aortoiliac atherosclerotic vascular disease. Reproductive: Apparently associated with the left adnexa and with face oral appearance along the adnexa, there is a 24.1 by 13.2 by 13.6 cm (volume = 2270 cm^3) multi cystic mass with thick septations and possible soft tissue component inferiorly. This extends up just below the transverse colon in the upper abdomen. The lesion abuts and displaces bowel. There also appears to be a complex cystic lesion in the cul-de-sac potentially with some associated free fluid, this lesion measures 5.3 by 4.2 by 4.4 cm (volume = 51 cm^3) and could represent another deposit of tumor. This raises the possibility of early peritoneal spread. Unfortunately, pelvic findings are obscured by the streak artifact from the patient's right hip prosthesis.  The uterus is mildly indistinct and grossly unremarkable. Other: No supplemental non-categorized findings. Musculoskeletal: Grade 1 degenerative anterolisthesis at L4-5, with right foraminal impingement due to disc uncovering and facet arthropathy. IMPRESSION: 1. Very large multicystic mass extending cephalad from the left adnexa, the mass measures up to 2270 cubic cm and extends up from the pelvis essentially to the level of the transverse colon. There is a high likelihood that this represents ovarian cancer. Correlate with tumor marker levels such as CA-125. Gynecologic-oncologic referral recommended. 2. In addition to the dominant tumor mass extending cephalad, there appears to be a tumor mass in the cul-de-sac which could represent some  early peritoneal spread of tumor. Unfortunately the region is somewhat obscured by streak artifact from the patient's right hip implant. 3. On parasagittal images, there is a swirled appearance at the base of the tumor, potentially reflecting torsion of the mass, which I speculate may be contributing to the patient's lower abdominal pain. 4. Hypodense liver lesions are probably benign but merits surveillance. 5. Bochdalek's hernia containing adipose tissue. 6. Mild left hydronephrosis terminating at the left UPJ, but without delayed excretion or delayed nephrogram, query chronic or congenital UPJ narrowing. 7. Sigmoid colon diverticulosis. 8. Mild right foraminal impingement L4-5 related to anterolisthesis and facet arthropathy. Radiology assistant personnel have been notified to put me in telephone contact with the referring physician or the referring physician's clinical representative in order to discuss these findings. Once this communication is established I will issue an addendum to this report for documentation purposes. Electronically Signed: By: Van Clines M.D. On: 07/19/2016 21:35    ____________________________________________   PROCEDURES Procedures  ____________________________________________   INITIAL IMPRESSION / ASSESSMENT AND PLAN / ED COURSE  Pertinent labs & imaging results that were available during my care of the patient were reviewed by me and considered in my medical decision making (see chart for details).  Patient presents with right lower quadrant abdominal pain for the past 2 days of vomiting, worsening symptoms. Concerning abdominal exam although she is status post appendectomy. We'll get a CT scan for further evaluation for possible obstruction or perforation. Low suspicion for a vascular event such as AAA dissection or mesenteric ischemia. IV fluids for hydration and Dilaudid for pain control in the meantime.  Clinical Course as of Jul 19 2204  Wed Jul 19, 2016  2152 D/w rads. D/w patient. Will consult Gyn.  CT Abdomen Pelvis W Contrast [PS]    Clinical Course User Index [PS] Carrie Mew, MD     ----------------------------------------- 10:05 PM on 07/19/2016 -----------------------------------------  Discussed with Dr. Jacquenette Shone clinic gynecology. Will follow up recs. Likely plan to admit.   ____________________________________________   FINAL CLINICAL IMPRESSION(S) / ED DIAGNOSES  Final diagnoses:  Right lower quadrant abdominal pain  Cyst of ovary, unspecified laterality      New Prescriptions   No medications on file     Portions of this note were generated with dragon dictation software. Dictation errors may occur despite best attempts at proofreading.    Carrie Mew, MD 07/19/16 [place]    Carrie Mew, MD 07/19/16 2206

## 2016-07-19 NOTE — ED Notes (Signed)
First nurse note  Brought over from [facility] with abd pain   Positive n/v

## 2016-07-20 LAB — CBC
HEMATOCRIT: 38.7 % (ref 35.0–47.0)
Hemoglobin: 13.3 g/dL (ref 12.0–16.0)
MCH: 31.9 pg (ref 26.0–34.0)
MCHC: 34.5 g/dL (ref 32.0–36.0)
MCV: 92.4 fL (ref 80.0–100.0)
Platelets: 216 10*3/uL (ref 150–440)
RBC: 4.19 MIL/uL (ref 3.80–5.20)
RDW: 13.8 % (ref 11.5–14.5)
WBC: 7.8 10*3/uL (ref 3.6–11.0)

## 2016-07-20 LAB — COMPREHENSIVE METABOLIC PANEL
ALBUMIN: 3.9 g/dL (ref 3.5–5.0)
ALT: 14 U/L (ref 14–54)
AST: 18 U/L (ref 15–41)
Alkaline Phosphatase: 50 U/L (ref 38–126)
Anion gap: 8 (ref 5–15)
BILIRUBIN TOTAL: 0.8 mg/dL (ref 0.3–1.2)
BUN: 10 mg/dL (ref 6–20)
CHLORIDE: 104 mmol/L (ref 101–111)
CO2: 27 mmol/L (ref 22–32)
Calcium: 9 mg/dL (ref 8.9–10.3)
Creatinine, Ser: 0.5 mg/dL (ref 0.44–1.00)
GFR calc Af Amer: >60 mL/min (ref >60)
GFR calc non Af Amer: >60 mL/min (ref >60)
GLUCOSE: 118 mg/dL — AB (ref 65–99)
POTASSIUM: 3.5 mmol/L (ref 3.5–5.1)
Sodium: 139 mmol/L (ref 135–145)
TOTAL PROTEIN: 6.5 g/dL (ref 6.5–8.1)

## 2016-07-20 LAB — TYPE AND SCREEN
ABO/RH(D): A POS
ANTIBODY SCREEN: NEGATIVE

## 2016-07-20 LAB — APTT: aPTT: 30 seconds (ref 24–36)

## 2016-07-20 LAB — PROTIME-INR
INR: 1.1
Prothrombin Time: 14.2 seconds (ref 11.4–15.2)

## 2016-07-20 MED ORDER — BUPIVACAINE LIPOSOME 1.3 % IJ SUSP
20.0000 mL | Freq: Once | INTRAMUSCULAR | Status: DC
  Filled 2016-07-20: qty 20

## 2016-07-20 MED ORDER — MORPHINE SULFATE 2 MG/ML IV SOLN
INTRAVENOUS | Status: DC
  Administered 2016-07-20: 01:00:00 via INTRAVENOUS
  Administered 2016-07-20: 1.77 mg via INTRAVENOUS
  Administered 2016-07-20: 5.32 mg via INTRAVENOUS
  Administered 2016-07-20: 3.99 mg via INTRAVENOUS
  Administered 2016-07-20: 4 mg via INTRAVENOUS
  Administered 2016-07-20: 4.98 mg via INTRAVENOUS
  Administered 2016-07-20: 2.11 mg via INTRAVENOUS
  Administered 2016-07-21: 11:00:00 via INTRAVENOUS
  Administered 2016-07-21: 5.54 mg via INTRAVENOUS
  Administered 2016-07-21: 4.98 mg via INTRAVENOUS
  Administered 2016-07-21: 5.62 mg via INTRAVENOUS
  Filled 2016-07-20: qty 30
  Filled 2016-07-20: qty 25

## 2016-07-20 MED ORDER — BUPIVACAINE HCL (PF) 0.5 % IJ SOLN
30.0000 mL | Freq: Once | INTRAMUSCULAR | Status: DC
  Filled 2016-07-20: qty 30

## 2016-07-20 MED ORDER — ACETAMINOPHEN 500 MG PO TABS
1000.0000 mg | ORAL_TABLET | Freq: Once | ORAL | Status: AC
  Administered 2016-07-21: 1000 mg via ORAL

## 2016-07-20 MED ORDER — CELECOXIB 200 MG PO CAPS
400.0000 mg | ORAL_CAPSULE | Freq: Once | ORAL | Status: AC
  Administered 2016-07-21: 400 mg via ORAL

## 2016-07-20 MED ORDER — CELECOXIB 200 MG PO CAPS: 400.0000 mg | ORAL_CAPSULE | Freq: Once | ORAL | Status: DC

## 2016-07-20 MED ORDER — GABAPENTIN 300 MG PO CAPS: 600.0000 mg | ORAL_CAPSULE | Freq: Once | ORAL | Status: DC

## 2016-07-20 MED ORDER — ACETAMINOPHEN 325 MG PO TABS
650.0000 mg | ORAL_TABLET | ORAL | Status: DC | PRN
  Administered 2016-07-20: 650 mg via ORAL
  Filled 2016-07-20: qty 2

## 2016-07-20 MED ORDER — ACETAMINOPHEN 500 MG PO TABS: 1000.0000 mg | ORAL_TABLET | Freq: Once | ORAL | Status: DC

## 2016-07-20 MED ORDER — GABAPENTIN 300 MG PO CAPS
600.0000 mg | ORAL_CAPSULE | Freq: Once | ORAL | Status: AC
  Administered 2016-07-21: 600 mg via ORAL

## 2016-07-20 NOTE — Progress Notes (Signed)
2 RNs checked the morphine PCA syringe, the patient's bracelet, the label on the pca syringe from pharmacy and the charting; every item matched; PCA started with 2 RNs at this time

## 2016-07-20 NOTE — Progress Notes (Signed)
   07/20/16 1300  Clinical Encounter Type  Visited With Patient;Family;Patient and family together  Visit Type Initial  Referral From Nurse  Advance Directives (For Healthcare)  Does Patient Have a Medical Advance Directive? No  Would patient like information on creating a medical advance directive? Yes (Inpatient - patient requests chaplain consult to create a medical advance directive)   Tammy Gould educated patient and family about the advance directive. Patient is alert and understood the material. Patient will complete the form at a later time; as family was visiting. [place] will follow-up with patient at a later time.

## 2016-07-20 NOTE — Progress Notes (Signed)
Patient agreeable to surgery tomorrow;  Scheduled to follow our other surgeries. Plan: Midline incision, BSO/TAH, with possible PPLND, omentectomy, peritoneal biopsies, and other surgeries as indicated. Pelvic washings. Discussed risks:  Bleeding requiring transfusion, infection, damage to nearby organs: bowel, bladder, ureters, liver, spleen, blood vessels, nerves, and blood clot.  Will send mass to pathology for frozen  Patient consented for above surgeries and for blood transfusion, witnessed by RN and family. She has created an advanced directive and needs to be notarized tomorrow AM.  She is able to have a reg/tolerated diet tonight, but NPO after midnight.  We discussed if this is cancer, the treatment after surgery will not be decided until after the full pathology is returned, properly staged, and will be discussed with oncology at another time.     Will prep with CHG wipes and shower tonight.    Preop meds to be given in pre-op tomorrow. Gabapentin 600mg  PO Celebrex 400mg  PO Tylenol 1000mg  PO Heparin 5000u sq   Type and screen, PT PTT, EKG tonight. CBC, CMP in AM  ----- Larey Days, MD Attending Obstetrician and Gynecologist [facility], Department of [facility]

## 2016-07-20 NOTE — Progress Notes (Signed)
Obstetric and Gynecology  HD # 2  Subjective   Admitted overnight with large adnexal mass without ascites, omental caking, or lymph node enlargement.    Has been NPO and on Dilaudid PCA, feels good, pain controlled, not especially hungry.  Prior to admission: denies significant change in bladder habits, perhaps increased frequency, but not anything she was complaining about.  She did notice some constipation recently, but not that long - went to urgent care for some pain and was told she was constipated.  No decrease in appetite.  She has lost 20# but intentionally - on weight watchers.   Voids spontaneously, no nausea or vomiting, ambulates without difficulty. No cp sob f/c/d or LE pain  Objective  Objective:   Vitals:   07/20/16 1049 07/20/16 1145 07/20/16 1629 07/20/16 1648  BP: 124/72  123/78   Pulse: 74  71   Resp: 20 15 16 14   Temp: 98.1 F (36.7 C)  98.6 F (37 C)   TempSrc: Oral  Oral   SpO2: 97% 94% 96% 95%  Weight:      Height:        General: NAD Cardiovascular: RRR, no murmurs Pulmonary: CTAB, normal respiratory effort Abdomen: distended, soft with masses palpable, no tympany, no fluid wave.  Minimally tender. Extremities: No erythema or cords, no calf tenderness, with normal peripheral pulses.  Labs: Results for orders placed or performed during the hospital encounter of 07/19/16 (from the past 24 hour(s))  Lipase, blood     Status: None   Collection Time: 07/19/16  5:45 PM  Result Value Ref Range   Lipase 26 11 - 51 U/L  Comprehensive metabolic panel     Status: Abnormal   Collection Time: 07/19/16  5:45 PM  Result Value Ref Range   Sodium 141 135 - 145 mmol/L   Potassium 3.8 3.5 - 5.1 mmol/L   Chloride 102 101 - 111 mmol/L   CO2 27 22 - 32 mmol/L   Glucose, Bld 155 (H) 65 - 99 mg/dL   BUN 10 6 - 20 mg/dL   Creatinine, Ser 0.55 0.44 - 1.00 mg/dL   Calcium 9.6 8.9 - 10.3 mg/dL   Total Protein 8.0 6.5 - 8.1 g/dL   Albumin 4.9 3.5 - 5.0 g/dL   AST  23 15 - 41 U/L   ALT 18 14 - 54 U/L   Alkaline Phosphatase 63 38 - 126 U/L   Total Bilirubin 1.0 0.3 - 1.2 mg/dL   GFR calc non Af Amer >60 >60 mL/min   GFR calc Af Amer >60 >60 mL/min   Anion gap 12 5 - 15  CBC     Status: None   Collection Time: 07/19/16  5:45 PM  Result Value Ref Range   WBC 7.2 3.6 - 11.0 K/uL   RBC 4.57 3.80 - 5.20 MIL/uL   Hemoglobin 14.5 12.0 - 16.0 g/dL   HCT 42.2 35.0 - 47.0 %   MCV 92.3 80.0 - 100.0 fL   MCH 31.8 26.0 - 34.0 pg   MCHC 34.5 32.0 - 36.0 g/dL   RDW 13.8 11.5 - 14.5 %   Platelets 219 150 - 440 K/uL  Urinalysis, Complete w Microscopic     Status: Abnormal   Collection Time: 07/19/16  5:45 PM  Result Value Ref Range   Color, Urine YELLOW (A) YELLOW   APPearance CLEAR (A) CLEAR   Specific Gravity, Urine 1.012 1.005 - 1.030   pH 7.0 5.0 - 8.0   Glucose, UA  50 (A) NEGATIVE mg/dL   Hgb urine dipstick NEGATIVE NEGATIVE   Bilirubin Urine NEGATIVE NEGATIVE   Ketones, ur 20 (A) NEGATIVE mg/dL   Protein, ur 30 (A) NEGATIVE mg/dL   Nitrite NEGATIVE NEGATIVE   Leukocytes, UA NEGATIVE NEGATIVE   RBC / HPF 0-5 0 - 5 RBC/hpf   WBC, UA 0-5 0 - 5 WBC/hpf   Bacteria, UA NONE SEEN NONE SEEN   Squamous Epithelial / LPF 0-5 (A) NONE SEEN  Comprehensive metabolic panel     Status: Abnormal   Collection Time: 07/20/16  5:22 AM  Result Value Ref Range   Sodium 139 135 - 145 mmol/L   Potassium 3.5 3.5 - 5.1 mmol/L   Chloride 104 101 - 111 mmol/L   CO2 27 22 - 32 mmol/L   Glucose, Bld 118 (H) 65 - 99 mg/dL   BUN 10 6 - 20 mg/dL   Creatinine, Ser 0.50 0.44 - 1.00 mg/dL   Calcium 9.0 8.9 - 10.3 mg/dL   Total Protein 6.5 6.5 - 8.1 g/dL   Albumin 3.9 3.5 - 5.0 g/dL   AST 18 15 - 41 U/L   ALT 14 14 - 54 U/L   Alkaline Phosphatase 50 38 - 126 U/L   Total Bilirubin 0.8 0.3 - 1.2 mg/dL   GFR calc non Af Amer >60 >60 mL/min   GFR calc Af Amer >60 >60 mL/min   Anion gap 8 5 - 15  CBC     Status: None   Collection Time: 07/20/16  5:22 AM  Result Value Ref  Range   WBC 7.8 3.6 - 11.0 K/uL   RBC 4.19 3.80 - 5.20 MIL/uL   Hemoglobin 13.3 12.0 - 16.0 g/dL   HCT 38.7 35.0 - 47.0 %   MCV 92.4 80.0 - 100.0 fL   MCH 31.9 26.0 - 34.0 pg   MCHC 34.5 32.0 - 36.0 g/dL   RDW 13.8 11.5 - 14.5 %   Platelets 216 150 - 440 K/uL     Imaging: Ct Abdomen Pelvis W Contrast  Addendum Date: 07/19/2016   ADDENDUM REPORT: 07/19/2016 21:51 ADDENDUM: The original report was by Dr. Van Clines. The following addendum is by Dr. Van Clines: Critical Value/emergent results were called by telephone at the time of interpretation on 07/19/2016 at 9:37 pm to Dr. Carrie Mew , who verbally acknowledged these results. This is considered a critical value due to the possible ovarian torsion, in this case due to the very large adnexal mass. Electronically Signed   By: Van Clines M.D.   On: 07/19/2016 21:51   Result Date: 07/19/2016 CLINICAL DATA:  Right lower quadrant abdominal pain for 2 days. EXAM: CT ABDOMEN AND PELVIS WITH CONTRAST TECHNIQUE: Multidetector CT imaging of the abdomen and pelvis was performed using the standard protocol following bolus administration of intravenous contrast. CONTRAST:  130mL ISOVUE-300 IOPAMIDOL (ISOVUE-300) INJECTION 61% COMPARISON:  Pelvic ultrasound from 02/20/2007 FINDINGS: Lower chest: Bochdalek hernia containing adipose tissue. Hepatobiliary: There are approximately 6 small hypodense lesions in the liver, technically too small to characterize although statistically likely to be cysts or small hemangiomas. The 2 lesions in segment 6 remain hypodense on delayed images and accordingly are probably small cysts. Small density dependently in the gallbladder on image 25/ 2 is probably a small gallstone. No biliary dilatation. Pancreas: 0.9 cm hypodense lesion posteriorly in the pancreatic body on image 37/1, uncertain connectivity to the dorsal pancreatic duct. Spleen: Unremarkable Adrenals/Urinary Tract: Mild left hydronephrosis  terminating at the  UPJ, but no delayed excretion or delayed nephrogram. Normal caliber ureter. Adrenal glands normal. Stomach/Bowel: Sigmoid colon diverticulosis. Vascular/Lymphatic: Aortoiliac atherosclerotic vascular disease. Reproductive: Apparently associated with the left adnexa and with face oral appearance along the adnexa, there is a 24.1 by 13.2 by 13.6 cm (volume = 2270 cm^3) multi cystic mass with thick septations and possible soft tissue component inferiorly. This extends up just below the transverse colon in the upper abdomen. The lesion abuts and displaces bowel. There also appears to be a complex cystic lesion in the cul-de-sac potentially with some associated free fluid, this lesion measures 5.3 by 4.2 by 4.4 cm (volume = 51 cm^3) and could represent another deposit of tumor. This raises the possibility of early peritoneal spread. Unfortunately, pelvic findings are obscured by the streak artifact from the patient's right hip prosthesis. The uterus is mildly indistinct and grossly unremarkable. Other: No supplemental non-categorized findings. Musculoskeletal: Grade 1 degenerative anterolisthesis at L4-5, with right foraminal impingement due to disc uncovering and facet arthropathy. IMPRESSION: 1. Very large multicystic mass extending cephalad from the left adnexa, the mass measures up to 2270 cubic cm and extends up from the pelvis essentially to the level of the transverse colon. There is a high likelihood that this represents ovarian cancer. Correlate with tumor marker levels such as CA-125. Gynecologic-oncologic referral recommended. 2. In addition to the dominant tumor mass extending cephalad, there appears to be a tumor mass in the cul-de-sac which could represent some early peritoneal spread of tumor. Unfortunately the region is somewhat obscured by streak artifact from the patient's right hip implant. 3. On parasagittal images, there is a swirled appearance at the base of the tumor, potentially  reflecting torsion of the mass, which I speculate may be contributing to the patient's lower abdominal pain. 4. Hypodense liver lesions are probably benign but merits surveillance. 5. Bochdalek's hernia containing adipose tissue. 6. Mild left hydronephrosis terminating at the left UPJ, but without delayed excretion or delayed nephrogram, query chronic or congenital UPJ narrowing. 7. Sigmoid colon diverticulosis. 8. Mild right foraminal impingement L4-5 related to anterolisthesis and facet arthropathy. Radiology assistant personnel have been notified to put me in telephone contact with the referring physician or the referring physician's clinical representative in order to discuss these findings. Once this communication is established I will issue an addendum to this report for documentation purposes. Electronically Signed: By: Van Clines M.D. On: 07/19/2016 21:35     Assessment   66 y.o. Hospital Day: 2  With adnexal mass, acute pain.  Plan   1. Mass:  Discussed frankly with patient and her family at bedside that our main concern is ovarian cancer, but her abdominal images are reassuring that even if this is, it does not appear to be advanced stage.  Her acute onset of pain is concerning for ovarian torsion, and perhaps that explains its size and complexity.    - ca 125 pending  - discussed the possibilities for surgery, but will definitely include TAH BSO, possible PPLND, omentectomy, and staging.   2. I discussed her case with [facility] and while they are willing to perform her surgery with our team, however they recommend if acute pain continues and she is requiring PCA to consider surgery now. 3. Patient asked to have an advanced directive made; will notify proper channels to have that done.  ----- Larey Days, MD Attending Obstetrician and Gynecologist [facility], Department of [facility]

## 2016-07-21 ENCOUNTER — Encounter: Payer: Self-pay | Admitting: *Deleted

## 2016-07-21 ENCOUNTER — Encounter
Admission: EM | Disposition: A | Discharge status: Home / Self Care | Payer: Self-pay | Source: Home / Self Care | Attending: Obstetrics and Gynecology

## 2016-07-21 ENCOUNTER — Inpatient Hospital Stay: Payer: Medicare Other | Admitting: Anesthesiology

## 2016-07-21 HISTORY — PX: ABDOMINAL HYSTERECTOMY: SHX81

## 2016-07-21 HISTORY — PX: SALPINGOOPHORECTOMY: SHX82

## 2016-07-21 LAB — COMPREHENSIVE METABOLIC PANEL
ALBUMIN: 3.5 g/dL (ref 3.5–5.0)
ALK PHOS: 43 U/L (ref 38–126)
ALT: 13 U/L — AB (ref 14–54)
AST: 17 U/L (ref 15–41)
Anion gap: 6 (ref 5–15)
BUN: 10 mg/dL (ref 6–20)
CALCIUM: 8.5 mg/dL — AB (ref 8.9–10.3)
CHLORIDE: 105 mmol/L (ref 101–111)
CO2: 27 mmol/L (ref 22–32)
CREATININE: 0.45 mg/dL (ref 0.44–1.00)
GFR calc non Af Amer: >60 mL/min (ref >60)
GLUCOSE: 118 mg/dL — AB (ref 65–99)
Potassium: 3.4 mmol/L — ABNORMAL LOW (ref 3.5–5.1)
SODIUM: 138 mmol/L (ref 135–145)
Total Bilirubin: 0.9 mg/dL (ref 0.3–1.2)
Total Protein: 5.7 g/dL — ABNORMAL LOW (ref 6.5–8.1)

## 2016-07-21 LAB — HEPATITIS B SURFACE ANTIGEN: Hepatitis B Surface Ag: NEGATIVE

## 2016-07-21 LAB — CBC
HCT: 34.3 % — ABNORMAL LOW (ref 35.0–47.0)
HEMOGLOBIN: 11.9 g/dL — AB (ref 12.0–16.0)
MCH: 32.5 pg (ref 26.0–34.0)
MCHC: 34.7 g/dL (ref 32.0–36.0)
MCV: 93.7 fL (ref 80.0–100.0)
PLATELETS: 175 10*3/uL (ref 150–440)
RBC: 3.66 MIL/uL — AB (ref 3.80–5.20)
RDW: 13.8 % (ref 11.5–14.5)
WBC: 6.5 10*3/uL (ref 3.6–11.0)

## 2016-07-21 LAB — CA 125: CANCER ANTIGEN (CA) 125: 29.3 U/mL (ref 0.0–38.1)

## 2016-07-21 SURGERY — HYSTERECTOMY, ABDOMINAL
Anesthesia: General | Laterality: Bilateral

## 2016-07-21 MED ORDER — PROPOFOL 10 MG/ML IV BOLUS
INTRAVENOUS | Status: DC | PRN
  Administered 2016-07-21: 100 mg via INTRAVENOUS

## 2016-07-21 MED ORDER — ACETAMINOPHEN 10 MG/ML IV SOLN
INTRAVENOUS | Status: DC | PRN
  Administered 2016-07-21: 1000 mg via INTRAVENOUS

## 2016-07-21 MED ORDER — MENTHOL 3 MG MT LOZG
1.0000 | LOZENGE | OROMUCOSAL | Status: DC | PRN
  Filled 2016-07-21: qty 9

## 2016-07-21 MED ORDER — ACETAMINOPHEN 500 MG PO TABS
1000.0000 mg | ORAL_TABLET | Freq: Four times a day (QID) | ORAL | Status: DC
  Administered 2016-07-21 – 2016-07-23 (×7): 1000 mg via ORAL
  Filled 2016-07-21 (×8): qty 2

## 2016-07-21 MED ORDER — MEPERIDINE HCL 50 MG/ML IJ SOLN: 6.2500 mg | INTRAMUSCULAR | Status: DC | PRN

## 2016-07-21 MED ORDER — GABAPENTIN 300 MG PO CAPS
ORAL_CAPSULE | ORAL | Status: AC
  Administered 2016-07-21: 600 mg via ORAL
  Filled 2016-07-21: qty 2

## 2016-07-21 MED ORDER — HEPARIN SODIUM (PORCINE) 5000 UNIT/ML IJ SOLN
5000.0000 [IU] | INTRAMUSCULAR | Status: AC
  Administered 2016-07-21: 5000 [IU] via SUBCUTANEOUS
  Filled 2016-07-21: qty 1

## 2016-07-21 MED ORDER — IBUPROFEN 600 MG PO TABS
600.0000 mg | ORAL_TABLET | Freq: Four times a day (QID) | ORAL | Status: DC
  Administered 2016-07-21 – 2016-07-23 (×7): 600 mg via ORAL
  Filled 2016-07-21 (×7): qty 1

## 2016-07-21 MED ORDER — SIMETHICONE 80 MG PO CHEW: 160.0000 mg | CHEWABLE_TABLET | Freq: Four times a day (QID) | ORAL | Status: DC | PRN

## 2016-07-21 MED ORDER — LACTATED RINGERS IV SOLN
INTRAVENOUS | Status: DC | PRN
  Administered 2016-07-21: 16:00:00 via INTRAVENOUS

## 2016-07-21 MED ORDER — BUPIVACAINE HCL (PF) 0.5 % IJ SOLN
INTRAMUSCULAR | Status: AC
  Filled 2016-07-21: qty 30

## 2016-07-21 MED ORDER — SODIUM CHLORIDE 0.9 % IV SOLN
INTRAVENOUS | Status: DC | PRN
  Administered 2016-07-21: 14:00:00 via INTRAVENOUS

## 2016-07-21 MED ORDER — HEPARIN SODIUM (PORCINE) 5000 UNIT/ML IJ SOLN
INTRAMUSCULAR | Status: AC
  Administered 2016-07-21: 5000 [IU] via SUBCUTANEOUS
  Filled 2016-07-21: qty 1

## 2016-07-21 MED ORDER — SOD CITRATE-CITRIC ACID 500-334 MG/5ML PO SOLN: 30.0000 mL | ORAL | Status: DC

## 2016-07-21 MED ORDER — OXYCODONE HCL 5 MG/5ML PO SOLN: 5.0000 mg | Freq: Once | ORAL | Status: DC | PRN

## 2016-07-21 MED ORDER — SODIUM CHLORIDE 0.9 % IV SOLN
50.0000 mL/h | INTRAVENOUS | Status: DC
  Administered 2016-07-21: 50 mL/h via INTRAVENOUS

## 2016-07-21 MED ORDER — OXYCODONE HCL 5 MG PO TABS: 10.0000 mg | ORAL_TABLET | ORAL | Status: DC | PRN

## 2016-07-21 MED ORDER — SUGAMMADEX SODIUM 200 MG/2ML IV SOLN
INTRAVENOUS | Status: AC
  Filled 2016-07-21: qty 2

## 2016-07-21 MED ORDER — ROCURONIUM BROMIDE 100 MG/10ML IV SOLN
INTRAVENOUS | Status: DC | PRN
  Administered 2016-07-21 (×2): 10 mg via INTRAVENOUS
  Administered 2016-07-21: 25 mg via INTRAVENOUS
  Administered 2016-07-21: 5 mg via INTRAVENOUS

## 2016-07-21 MED ORDER — FENTANYL CITRATE (PF) 250 MCG/5ML IJ SOLN
INTRAMUSCULAR | Status: AC
  Filled 2016-07-21: qty 5

## 2016-07-21 MED ORDER — FENTANYL CITRATE (PF) 100 MCG/2ML IJ SOLN: 25.0000 ug | INTRAMUSCULAR | Status: DC | PRN

## 2016-07-21 MED ORDER — GABAPENTIN 300 MG PO CAPS
600.0000 mg | ORAL_CAPSULE | Freq: Every day | ORAL | Status: DC
  Administered 2016-07-21 – 2016-07-22 (×2): 600 mg via ORAL
  Filled 2016-07-21 (×2): qty 2

## 2016-07-21 MED ORDER — DEXTROSE 5 % IV SOLN
INTRAVENOUS | Status: AC
  Administered 2016-07-21: 100 mL via INTRAVENOUS
  Filled 2016-07-21: qty 10

## 2016-07-21 MED ORDER — KETOROLAC TROMETHAMINE 30 MG/ML IJ SOLN
INTRAMUSCULAR | Status: DC | PRN
  Administered 2016-07-21: 30 mg via INTRAVENOUS

## 2016-07-21 MED ORDER — LIDOCAINE HCL (PF) 2 % IJ SOLN
INTRAMUSCULAR | Status: AC
  Filled 2016-07-21: qty 2

## 2016-07-21 MED ORDER — DOCUSATE SODIUM 100 MG PO CAPS
100.0000 mg | ORAL_CAPSULE | Freq: Two times a day (BID) | ORAL | Status: DC
  Administered 2016-07-21 – 2016-07-23 (×4): 100 mg via ORAL
  Filled 2016-07-21 (×4): qty 1

## 2016-07-21 MED ORDER — ROCURONIUM BROMIDE 50 MG/5ML IV SOLN
INTRAVENOUS | Status: AC
  Filled 2016-07-21: qty 1

## 2016-07-21 MED ORDER — SUCCINYLCHOLINE CHLORIDE 20 MG/ML IJ SOLN
INTRAMUSCULAR | Status: DC | PRN
  Administered 2016-07-21: 100 mg via INTRAVENOUS

## 2016-07-21 MED ORDER — ONDANSETRON HCL 4 MG/2ML IJ SOLN
INTRAMUSCULAR | Status: DC | PRN
  Administered 2016-07-21: 4 mg via INTRAVENOUS

## 2016-07-21 MED ORDER — ACETAMINOPHEN NICU IV SYRINGE 10 MG/ML
INTRAVENOUS | Status: AC
  Filled 2016-07-21: qty 1

## 2016-07-21 MED ORDER — ONDANSETRON HCL 4 MG/2ML IJ SOLN
INTRAMUSCULAR | Status: AC
  Filled 2016-07-21: qty 2

## 2016-07-21 MED ORDER — ENOXAPARIN SODIUM 40 MG/0.4ML ~~LOC~~ SOLN
40.0000 mg | SUBCUTANEOUS | Status: DC
  Administered 2016-07-22 – 2016-07-23 (×2): 40 mg via SUBCUTANEOUS
  Filled 2016-07-21 (×2): qty 0.4

## 2016-07-21 MED ORDER — FENTANYL CITRATE (PF) 100 MCG/2ML IJ SOLN
INTRAMUSCULAR | Status: DC | PRN
  Administered 2016-07-21: 25 ug via INTRAVENOUS
  Administered 2016-07-21 (×3): 50 ug via INTRAVENOUS

## 2016-07-21 MED ORDER — ACETAMINOPHEN 500 MG PO TABS
ORAL_TABLET | ORAL | Status: AC
  Filled 2016-07-21: qty 2

## 2016-07-21 MED ORDER — SODIUM CHLORIDE 0.9 % IV SOLN
INTRAVENOUS | Status: DC | PRN
  Administered 2016-07-21: 70 mL

## 2016-07-21 MED ORDER — ONDANSETRON HCL 4 MG/2ML IJ SOLN: 4.0000 mg | Freq: Four times a day (QID) | INTRAMUSCULAR | Status: DC | PRN

## 2016-07-21 MED ORDER — OXYCODONE HCL 5 MG PO TABS: 5.0000 mg | ORAL_TABLET | Freq: Once | ORAL | Status: DC | PRN

## 2016-07-21 MED ORDER — DEXAMETHASONE SODIUM PHOSPHATE 10 MG/ML IJ SOLN
INTRAMUSCULAR | Status: DC | PRN
  Administered 2016-07-21: 5 mg via INTRAVENOUS

## 2016-07-21 MED ORDER — PROPOFOL 10 MG/ML IV BOLUS
INTRAVENOUS | Status: AC
  Filled 2016-07-21: qty 20

## 2016-07-21 MED ORDER — KETOROLAC TROMETHAMINE 30 MG/ML IJ SOLN
INTRAMUSCULAR | Status: AC
  Filled 2016-07-21: qty 1

## 2016-07-21 MED ORDER — MIDAZOLAM HCL 2 MG/2ML IJ SOLN
INTRAMUSCULAR | Status: AC
  Filled 2016-07-21: qty 2

## 2016-07-21 MED ORDER — LIDOCAINE HCL (CARDIAC) 20 MG/ML IV SOLN
INTRAVENOUS | Status: DC | PRN
  Administered 2016-07-21: 60 mg via INTRAVENOUS

## 2016-07-21 MED ORDER — PHENYLEPHRINE HCL 10 MG/ML IJ SOLN
INTRAMUSCULAR | Status: DC | PRN
  Administered 2016-07-21: 150 ug via INTRAVENOUS
  Administered 2016-07-21: 100 ug via INTRAVENOUS
  Administered 2016-07-21: 150 ug via INTRAVENOUS
  Administered 2016-07-21 (×4): 100 ug via INTRAVENOUS
  Administered 2016-07-21: 150 ug via INTRAVENOUS

## 2016-07-21 MED ORDER — DEXAMETHASONE SODIUM PHOSPHATE 10 MG/ML IJ SOLN
INTRAMUSCULAR | Status: AC
  Filled 2016-07-21: qty 1

## 2016-07-21 MED ORDER — MIDAZOLAM HCL 2 MG/2ML IJ SOLN
INTRAMUSCULAR | Status: DC | PRN
Start: 2016-07-21 — End: 2016-07-21
  Administered 2016-07-21: 2 mg via INTRAVENOUS

## 2016-07-21 MED ORDER — PROMETHAZINE HCL 25 MG/ML IJ SOLN: 6.2500 mg | INTRAMUSCULAR | Status: DC | PRN

## 2016-07-21 MED ORDER — SODIUM CHLORIDE 0.9 % IJ SOLN
INTRAMUSCULAR | Status: AC
  Filled 2016-07-21: qty 50

## 2016-07-21 MED ORDER — SUCCINYLCHOLINE CHLORIDE 20 MG/ML IJ SOLN
INTRAMUSCULAR | Status: AC
  Filled 2016-07-21: qty 1

## 2016-07-21 MED ORDER — BUPIVACAINE HCL 0.5 % IJ SOLN
INTRAMUSCULAR | Status: DC | PRN
  Administered 2016-07-21: 30 mL

## 2016-07-21 MED ORDER — ONDANSETRON HCL 4 MG PO TABS: 4.0000 mg | ORAL_TABLET | Freq: Four times a day (QID) | ORAL | Status: DC | PRN

## 2016-07-21 MED ORDER — KETOROLAC TROMETHAMINE 15 MG/ML IJ SOLN
15.0000 mg | Freq: Four times a day (QID) | INTRAMUSCULAR | Status: DC | PRN
  Filled 2016-07-21: qty 1

## 2016-07-21 MED ORDER — OXYCODONE HCL 5 MG PO TABS: 5.0000 mg | ORAL_TABLET | ORAL | Status: DC | PRN

## 2016-07-21 MED ORDER — HYDROMORPHONE HCL 1 MG/ML IJ SOLN: 0.2000 mg | INTRAMUSCULAR | Status: DC | PRN

## 2016-07-21 MED ORDER — SUGAMMADEX SODIUM 200 MG/2ML IV SOLN
INTRAVENOUS | Status: DC | PRN
  Administered 2016-07-21: 160 mg via INTRAVENOUS

## 2016-07-21 MED ORDER — EVICEL 5 ML EX KIT
PACK | CUTANEOUS | Status: AC
  Filled 2016-07-21: qty 1

## 2016-07-21 MED ORDER — BUPIVACAINE LIPOSOME 1.3 % IJ SUSP
INTRAMUSCULAR | Status: AC
  Filled 2016-07-21: qty 20

## 2016-07-21 MED ORDER — BOOST / RESOURCE BREEZE PO LIQD
1.0000 | Freq: Three times a day (TID) | ORAL | Status: DC
  Administered 2016-07-22 (×3): 1 via ORAL
  Filled 2016-07-21: qty 1

## 2016-07-21 MED ORDER — EVICEL 5 ML EX KIT
PACK | CUTANEOUS | Status: DC | PRN
  Administered 2016-07-21: 5 mL via TOPICAL

## 2016-07-21 MED ORDER — CELECOXIB 200 MG PO CAPS
ORAL_CAPSULE | ORAL | Status: AC
  Administered 2016-07-21: 400 mg via ORAL
  Filled 2016-07-21: qty 2

## 2016-07-21 SURGICAL SUPPLY — 41 items
CANISTER SUCT 1200ML W/VALVE (MISCELLANEOUS) ×3 IMPLANT
CATH TRAY 16F METER LATEX (MISCELLANEOUS) ×3 IMPLANT
CHLORAPREP W/TINT 26ML (MISCELLANEOUS) ×3 IMPLANT
CLOSURE WOUND 1/2 X4 (GAUZE/BANDAGES/DRESSINGS)
DERMABOND ADVANCED (GAUZE/BANDAGES/DRESSINGS) ×2
DERMABOND ADVANCED .7 DNX12 (GAUZE/BANDAGES/DRESSINGS) ×1 IMPLANT
DRAPE LAPAROTOMY 100X77 ABD (DRAPES) ×3 IMPLANT
DRAPE LAPAROTOMY TRNSV 106X77 (MISCELLANEOUS) IMPLANT
DRSG TELFA 3X8 NADH (GAUZE/BANDAGES/DRESSINGS) IMPLANT
ELECT BLADE 6 FLAT ULTRCLN (ELECTRODE) ×3 IMPLANT
ELECT CAUTERY BLADE 6.4 (BLADE) ×3 IMPLANT
ELECT REM PT RETURN 9FT ADLT (ELECTROSURGICAL) ×3
ELECTRODE REM PT RTRN 9FT ADLT (ELECTROSURGICAL) ×1 IMPLANT
GAUZE SPONGE 4X4 12PLY STRL (GAUZE/BANDAGES/DRESSINGS) ×3 IMPLANT
GLOVE PI ORTHOPRO 6.5 (GLOVE) ×2
GLOVE PI ORTHOPRO STRL 6.5 (GLOVE) ×1 IMPLANT
GLOVE SURG SYN 6.5 ES PF (GLOVE) ×3 IMPLANT
GOWN STRL REUS W/ TWL LRG LVL3 (GOWN DISPOSABLE) ×2 IMPLANT
GOWN STRL REUS W/TWL LRG LVL3 (GOWN DISPOSABLE) ×4
HANDLE SUCTION POOLE (INSTRUMENTS) ×1 IMPLANT
KIT RM TURNOVER CYSTO AR (KITS) ×3 IMPLANT
NDL MAYO CATGUT SZ4 (NEEDLE) IMPLANT
NS IRRIG 1000ML POUR BTL (IV SOLUTION) ×3 IMPLANT
PACK BASIN MAJOR ARMC (MISCELLANEOUS) ×3 IMPLANT
PAD OB MATERNITY 4.3X12.25 (PERSONAL CARE ITEMS) ×3 IMPLANT
SPONGE LAP 18X18 5 PK (GAUZE/BANDAGES/DRESSINGS) ×3 IMPLANT
STAPLER SKIN PROX 35W (STAPLE) IMPLANT
STRIP CLOSURE SKIN 1/2X4 (GAUZE/BANDAGES/DRESSINGS) IMPLANT
SUCTION POOLE HANDLE (INSTRUMENTS) ×3
SUT ETHIBOND 0 (SUTURE) IMPLANT
SUT MAXON ABS #0 GS21 30IN (SUTURE) ×6 IMPLANT
SUT VIC AB 0 CT1 18XCR BRD 8 (SUTURE) ×2 IMPLANT
SUT VIC AB 0 CT1 27 (SUTURE) ×6
SUT VIC AB 0 CT1 27XCR 8 STRN (SUTURE) ×3 IMPLANT
SUT VIC AB 0 CT1 36 (SUTURE) ×6 IMPLANT
SUT VIC AB 0 CT1 8-18 (SUTURE) ×4
SUT VIC AB 1 CT1 36 (SUTURE) IMPLANT
SUT VIC AB 4-0 PS2 18 (SUTURE) IMPLANT
SUT VICRYL PLUS ABS 0 54 (SUTURE) ×3 IMPLANT
SYR CONTROL 10ML (SYRINGE) ×3 IMPLANT
TRAY PREP VAG/GEN (MISCELLANEOUS) ×3 IMPLANT

## 2016-07-21 NOTE — Op Note (Addendum)
[facility]is[facility]re[facility] PROCEDURE DATE: 07/19/2016 - 07/21/2016  PREOPERATIVE DIAGNOSIS:  Symptomatic fibroids, menorrhagia, pelvic pain POSTOPERATIVE DIAGNOSIS:  Same SURGEON:   Larey Days, M.D. ASSISTANT: Wilson Singer, M.D. OPERATION:  Total abdominal hysterectomy, Bilateral Salpingectomy-oophorectomy ANESTHESIA:  General endotracheal.  INDICATIONS: The patient is a 66 y.o. who presented to the ED with abdominopelvic pain and found to have a large mass in her abdomen. She was recommended surgery, and the preoperative visit, the risks, benefits, indications, and alternatives of the procedure were reviewed with the patient.  On the day of surgery, the risks of surgery were again discussed with the patient including but not limited to: bleeding which may require transfusion or reoperation; infection which may require antibiotics; injury to bowel, bladder, ureters or other surrounding organs; need for additional procedures; thromboembolic phenomenon, incisional problems and other postoperative/anesthesia complications. Written informed consent was obtained.    OPERATIVE FINDINGS:  Moderate amount of peritoneal fluid RIGHT ovarian torsion, 3x twisted around the IP and fallopian tube, distended and multi-loculated. One discreet cyst was ruptured for clear brown fluid  upon manipulation out of the laparotomy - no fluid entered back into the abdominal cavity  This was sent for frozen evaluation. Normal appearing uterus, normal left tube, small cystic left ovary   Normal appearing upper abdomen Bilateral ureters vermiculaing   ESTIMATED BLOOD LOSS: 450 ml FLUIDS:   2L of Lactated Ringers URINE OUTPUT:368ml of clear yellow urine. Ascites: 75cc amber colored fluid SPECIMENS: Peritoneal fluid,  Uterus,cervix,  bilateral fallopian tubes and ovaries.  RIGHT ovary sent to pathology for frozen section: no malignancy or atypia noted, +hemorrage and infarct, consistent with torsion  COMPLICATIONS:  None  apparent.   DESCRIPTION OF PROCEDURE:  The patient received subcutaneous heparin, intravenous antibiotics, and had sequential compression devices applied to her lower extremities while in the preoperative area.   She was taken to the operating room and placed under general anesthesia without difficulty; she was kept supine. A Foley catheter was inserted into the bladder and attached to constant drainage.The abdomen was prepped and draped in a sterile manner.  After a surgical timeout was performed, a midline skin incision was made. This incision was taken down to the fascia using electrocautery with care given to maintain good hemostasis. The fascia was incised in the midline and extended cephalad and caudad using electrocautery. The underlying rectus muscles were divided in the midline and peritoneum opened.  Upon entry there was noted to be a small amount of ascites, amber colored, and this was collected as peritoneal fluid for pathology. A large, multi-cystic blue-colored mass was immediately present in the mid-abdomen, it was inspected, palpated and removed through the incision.  Upon manipulation out of the abdomen, one of the cysts was inadvertently ruptured for clear brown fluid.  This was ruptured outside of the abdominal cavity; no fluid re-entered the abdomen. The mass was identified as the right ovary, which was round to be rotated three times upon its IP ligament and involving the fallopian tube.  This was lifted out of the pelvis and the dilated IP was doubly clamped, divided, and tied down with both a free-tie of 0 vicryl and a haney stitch.  The mass was removed and sent to pathology for frozen section.   Attention was then turned to the pelvis. A bookwalter retractor was placed into the incision, and the bowel was packed away with moist laparotomy sponges. The uterus at this point was noted to be mobile, and was delivered up out of the  abdomen.  The round ligaments on each side were clamped,  suture ligated with 0 Vicryl, and transected with electrocautery allowing entry into the broad ligament. Of note, all sutures used in this procedure are 0 Vicryl unless otherwise noted. The anterior and posterior leaves of the broad ligament were separated, and the ureters were inspected to be safely away from the area of dissection bilaterally.  An opening was created in the clear portion of the posterior broad ligament and the infundibulopelvic ligament clamped on the patient's left side. This pedicle was then clamped, cut, and doubly suture ligated with good hemostasis.  A bladder flap was then created.  The bladder was then bluntly dissected off the lower uterine segment and cervix with good hemostasis noted. In step-wise fashion, the bilateral uterine arteries, uterosacral ligaments and cardinal ligaments were clamped, cut, and suture ligated bilaterally.  Acutely curved clamps were placed across the vagina just under the cervix, and the specimen was amputated and sent to pathology. The vaginal cuff angles were closed with Heaney stiches with care given to incorporate the uterosacral-cardinal ligament pedicles on both sides. The middle of the vaginal cuff was closed with a series of interrupted figure-of-eight sutures, incorporating the anterior pubocervical fascia and the posterior rectovaginal fascia.   The pelvis was irrigated and hemostasis was reconfirmed at all pedicles and along the pelvic sidewall.  The ureters were inspected and noted to be vermiculating bilaterally.   Eviseal coagulant was placed over surgical beds.  All laparotomy sponges and instruments were removed from the abdomen. The fascia was closed with two opposing running stitches of 0-Vicryl, meeting in the middle.  60cc of long-and short-acting Bupivacaine was injected circumferentially into the fascia. The subcutaneous layer was irrigated and reapproximated with 3-0 vicryl. The skin was closed with a 4-0 Vicryl subcuticular stitch,  injected with 40cc of short- and long-acting bupivacaine, and covered with skin glue.  Sponge, lap, needle, and instrument counts were correct times two. The patient was taken to the recovery area awake, extubated and in stable condition.  Larey Days, MD Attending [facility]

## 2016-07-21 NOTE — Progress Notes (Signed)
[place] paged to review HCPOA and contact notary and witnesses to complete HCPOA.  HCPOA notarized and now in pt chart.

## 2016-07-21 NOTE — Progress Notes (Signed)
Obstetric and Gynecology  HD # 3  Subjective  Patient doing well, no complaints, tolerated PO intake last night, has been NPO since this AM, still on dilaudid PCA, ambulating without difficulty, voiding spontaneously.     Denies CP, SOB, F/C, N/V/D, or leg pain. Feels more pain on RLQ side than before, worse with bending.   Objective  Objective:   Vitals:   07/21/16 0755 07/21/16 1035 07/21/16 1052 07/21/16 1228  BP:    122/75  Pulse:    82  Resp: 19 (!) 24 (!) 22 17  Temp:    98.3 F (36.8 C)  TempSrc:    Oral  SpO2: 93% 95% 95% 97%  Weight:      Height:        General: NAD Cardiovascular: RRR, no murmurs Pulmonary: CTAB, normal respiratory effort Abdomen: enlarged, distended, soft with masses, non-tender. Extremities: No erythema or cords, no calf tenderness, with normal peripheral pulses.  Labs: Results for orders placed or performed during the hospital encounter of 07/19/16 (from the past 24 hour(s))  Protime-INR     Status: None   Collection Time: 07/20/16  9:12 PM  Result Value Ref Range   Prothrombin Time 14.2 11.4 - 15.2 seconds   INR 1.10   Type and screen [place]     Status: None   Collection Time: 07/20/16  9:12 PM  Result Value Ref Range   ABO/RH(D) A POS    Antibody Screen NEG    Sample Expiration 07/23/2016   APTT     Status: None   Collection Time: 07/20/16  9:12 PM  Result Value Ref Range   aPTT 30 24 - 36 seconds  CBC     Status: Abnormal   Collection Time: 07/21/16  6:25 AM  Result Value Ref Range   WBC 6.5 3.6 - 11.0 K/uL   RBC 3.66 (L) 3.80 - 5.20 MIL/uL   Hemoglobin 11.9 (L) 12.0 - 16.0 g/dL   HCT 34.3 (L) 35.0 - 47.0 %   MCV 93.7 80.0 - 100.0 fL   MCH 32.5 26.0 - 34.0 pg   MCHC 34.7 32.0 - 36.0 g/dL   RDW 13.8 11.5 - 14.5 %   Platelets 175 150 - 440 K/uL  Comprehensive metabolic panel     Status: Abnormal   Collection Time: 07/21/16  6:25 AM  Result Value Ref Range   Sodium 138 135 - 145 mmol/L   Potassium 3.4 (L) 3.5 - 5.1 mmol/L   Chloride 105 101 - 111 mmol/L   CO2 27 22 - 32 mmol/L   Glucose, Bld 118 (H) 65 - 99 mg/dL   BUN 10 6 - 20 mg/dL   Creatinine, Ser 0.45 0.44 - 1.00 mg/dL   Calcium 8.5 (L) 8.9 - 10.3 mg/dL   Total Protein 5.7 (L) 6.5 - 8.1 g/dL   Albumin 3.5 3.5 - 5.0 g/dL   AST 17 15 - 41 U/L   ALT 13 (L) 14 - 54 U/L   Alkaline Phosphatase 43 38 - 126 U/L   Total Bilirubin 0.9 0.3 - 1.2 mg/dL   GFR calc non Af Amer >60 >60 mL/min   GFR calc Af Amer >60 >60 mL/min   Anion gap 6 5 - 15      Assessment   66 y.o. Hospital Day: 3 with large adnexal mass  Plan   1. Plan still in place for surgery today.   To or when called. Gabapentin 600, celecoxib 400, tylenol 1000mg  PO  in preop,  Heparin prior to anesthesia start.  2. Plan to d/c PCA after surgery today.  ----- Larey Days, MD Attending Obstetrician and Gynecologist [facility], Department of [facility]

## 2016-07-21 NOTE — Care Management Important Message (Signed)
Important Message  Patient Details  Name: Tammy Gould MRN: [id number] Date of Birth: 11-13-1950   Medicare Important Message Given:  Yes    Shelbie Ammons, RN 07/21/2016, 7:46 AM

## 2016-07-21 NOTE — Anesthesia Post-op Follow-up Note (Cosign Needed)
Anesthesia QCDR form completed.        

## 2016-07-21 NOTE — Anesthesia Procedure Notes (Signed)
Procedure Name: Intubation Date/Time: 07/21/2016 2:00 PM Performed by: Dionne Bucy Pre-anesthesia Checklist: Patient identified, Patient being monitored, Timeout performed, Emergency Drugs available and Suction available Patient Re-evaluated:Patient Re-evaluated prior to induction Oxygen Delivery Method: Circle system utilized Preoxygenation: Pre-oxygenation with 100% oxygen Induction Type: IV induction Ventilation: Mask ventilation without difficulty Laryngoscope Size: Mac and 3 Grade View: Grade I Tube type: Oral Tube size: 7.0 mm Number of attempts: 2 Airway Equipment and Method: Stylet Placement Confirmation: ETT inserted through vocal cords under direct vision,  positive ETCO2 and breath sounds checked- equal and bilateral Secured at: 21 cm Tube secured with: Tape Dental Injury: Teeth and Oropharynx as per pre-operative assessment

## 2016-07-21 NOTE — Transfer of Care (Signed)
Immediate Anesthesia Transfer of Care Note  Patient: Tammy Gould  Procedure(s) Performed: Procedure(s): HYSTERECTOMY ABDOMINAL (Bilateral) SALPINGO OOPHORECTOMY (Bilateral)  Patient Location: PACU  Anesthesia Type:General  Level of Consciousness: sedated  Airway & Oxygen Therapy: Patient Spontanous Breathing and Patient connected to face mask oxygen  Post-op Assessment: Report given to RN and Post -op Vital signs reviewed and stable  Post vital signs: Reviewed and stable  Last Vitals:  Vitals:   07/21/16 1307 07/21/16 1702  BP: 139/88 (P) 99/67  Pulse: 79 65  Resp: 16 16  Temp: (!) 36.1 C (P) 36.8 C    Last Pain:  Vitals:   07/21/16 1702  TempSrc:   PainSc: (P) Asleep      Patients Stated Pain Goal: 0 (08/81/10 3159)  Complications: No apparent anesthesia complications

## 2016-07-21 NOTE — Anesthesia Preprocedure Evaluation (Addendum)
Anesthesia Evaluation  Patient identified by MRN, date of birth, ID band Patient awake    Reviewed: Allergy & Precautions, NPO status , Patient's Chart, lab work & pertinent test results  History of Anesthesia Complications Negative for: history of anesthetic complications  Airway Mallampati: III  TM Distance: >3 FB Neck ROM: Full    Dental no notable dental hx.    Pulmonary sleep apnea and Continuous Positive Airway Pressure Ventilation , neg COPD, former smoker,    breath sounds clear to auscultation- rhonchi (-) wheezing      Cardiovascular Exercise Tolerance: Good (-) hypertension(-) CAD, (-) Past MI and (-) Cardiac Stents  Rhythm:Regular Rate:Normal - Systolic murmurs and - Diastolic murmurs    Neuro/Psych Anxiety negative neurological ROS     GI/Hepatic negative GI ROS, Neg liver ROS,   Endo/Other  negative endocrine ROSneg diabetes  Renal/GU negative Renal ROS     Musculoskeletal  (+) Arthritis ,   Abdominal (+) - obese,   Peds  Hematology negative hematology ROS (+)   Anesthesia Other Findings Past Medical History: No date: Anxiety No date: Arthritis 1972: Hepatitis B No date: Pre-diabetes No date: Prediabetes No date: Sleep apnea     Comment:  uses cpap   Reproductive/Obstetrics                             Anesthesia Physical Anesthesia Plan  ASA: II  Anesthesia Plan: General   Post-op Pain Management:    Induction: Intravenous  PONV Risk Score and Plan: 2 and Dexamethasone and Ondansetron  Airway Management Planned: Oral ETT  Additional Equipment:   Intra-op Plan:   Post-operative Plan: Extubation in OR  Informed Consent: I have reviewed the patients History and Physical, chart, labs and discussed the procedure including the risks, benefits and alternatives for the proposed anesthesia with the patient or authorized representative who has indicated his/her  understanding and acceptance.   Dental advisory given  Plan Discussed with: CRNA and Anesthesiologist  Anesthesia Plan Comments:        Anesthesia Quick Evaluation

## 2016-07-22 LAB — CBC
HCT: 30.8 % — ABNORMAL LOW (ref 35.0–47.0)
HEMOGLOBIN: 10.9 g/dL — AB (ref 12.0–16.0)
MCH: 33 pg (ref 26.0–34.0)
MCHC: 35.3 g/dL (ref 32.0–36.0)
MCV: 93.5 fL (ref 80.0–100.0)
Platelets: 164 10*3/uL (ref 150–440)
RBC: 3.3 MIL/uL — AB (ref 3.80–5.20)
RDW: 13.5 % (ref 11.5–14.5)
WBC: 6.7 10*3/uL (ref 3.6–11.0)

## 2016-07-22 LAB — BASIC METABOLIC PANEL
Anion gap: 6 (ref 5–15)
BUN: 10 mg/dL (ref 6–20)
CHLORIDE: 107 mmol/L (ref 101–111)
CO2: 25 mmol/L (ref 22–32)
Calcium: 8.2 mg/dL — ABNORMAL LOW (ref 8.9–10.3)
Creatinine, Ser: 0.48 mg/dL (ref 0.44–1.00)
GFR calc Af Amer: >60 mL/min (ref >60)
GFR calc non Af Amer: >60 mL/min (ref >60)
GLUCOSE: 127 mg/dL — AB (ref 65–99)
POTASSIUM: 3.5 mmol/L (ref 3.5–5.1)
Sodium: 138 mmol/L (ref 135–145)

## 2016-07-22 MED ORDER — DULOXETINE HCL 60 MG PO CPEP
60.0000 mg | ORAL_CAPSULE | Freq: Every day | ORAL | Status: DC
  Administered 2016-07-22 – 2016-07-23 (×2): 60 mg via ORAL
  Filled 2016-07-22 (×2): qty 1

## 2016-07-23 ENCOUNTER — Encounter: Payer: Self-pay | Admitting: Obstetrics & Gynecology

## 2016-07-23 MED ORDER — IBUPROFEN 600 MG PO TABS: 600.0000 mg | ORAL_TABLET | Freq: Four times a day (QID) | ORAL | 1 refills | Status: DC

## 2016-07-23 MED ORDER — OXYCODONE HCL 5 MG PO TABS: 5.0000 mg | ORAL_TABLET | ORAL | 0 refills | Status: DC | PRN

## 2016-07-23 MED ORDER — POLYETHYLENE GLYCOL 3350 17 GM/SCOOP PO POWD: 1.0000 | Freq: Every day | ORAL | 1 refills | Status: DC

## 2016-07-23 MED ORDER — ACETAMINOPHEN 500 MG PO TABS: 1000.0000 mg | ORAL_TABLET | Freq: Four times a day (QID) | ORAL | 0 refills | Status: DC

## 2016-07-23 NOTE — Progress Notes (Signed)
Discharge instructions complete and prescriptions given. Patient verbalizes understanding of teaching. Patient discharged home at 1325.

## 2016-07-23 NOTE — Discharge Instructions (Signed)
Discharge instructions:  Call office if you have any of the following: fever >101 F, chills, excessive vaginal bleeding, incision drainage or problems, leg pain or redness, or any other concerns.   Activity: Do not lift > 10 lbs for 8 weeks.  No intercourse or tampons for 8 weeks.  No driving for 1-2 weeks.   Take 600mg  Ibuprofen and 1000mg  Tylenol around the clock, every 6 hours for at least the first 5-7 days.  After this you can take as needed.  This will help decrease inflammation and promote healing.  The narcotics you'll take just as needed, as they just trick your brain into thinking its not in pain.    Please don't limit yourself in terms of routine activity.  You will be able to do most things, although they may take longer to do or be a little painful.  You can do it!  Don't be a hero, but don't be a wimp either!

## 2016-07-23 NOTE — Discharge Summary (Signed)
Gynecology Physician Postoperative Discharge Summary  Patient ID: Tammy Gould MRN: [id number] DOB/AGE: Jul 05, 1950 66 y.o.  Admit Date: 07/19/2016 Discharge Date: 07/23/2016  Preoperative Diagnoses: abdominopelvic mass Postoperative Diagnosis: right ovarian torsion  Procedures: Procedure(s) (LRB): HYSTERECTOMY ABDOMINAL (Bilateral) SALPINGO OOPHORECTOMY (Bilateral)  CBC Latest Ref Rng & Units 07/22/2016 07/21/2016 07/20/2016  WBC 3.6 - 11.0 K/uL 6.7 6.5 7.8  Hemoglobin 12.0 - 16.0 g/dL 10.9(L) 11.9(L) 13.3  Hematocrit 35.0 - 47.0 % 30.8(L) 34.3(L) 38.7  Platelets 150 - 440 K/uL 164 175 216    Hospital Course:  Tammy Gould is a 66 y.o. who was admitted from the ED with abdominal pain and found to have a large abdominopelvic mass, suspected to be from the adnexa.  She underwent the procedures as mentioned above, her operation was uncomplicated. For further details about surgery, please refer to the operative report. Patient had an uncomplicated postoperative course. By time of discharge on POD#2, her pain was controlled on oral pain medications; she was ambulating, voiding without difficulty, tolerating regular diet and passing flatus. She was deemed stable for discharge to home.   Discharge Exam: Blood pressure 124/74, pulse 62, temperature 97.8 F (36.6 C), temperature source Oral, resp. rate 18, height 5\' 5"  (1.651 m), weight 79.8 kg (176 lb), SpO2 97 %. General appearance: alert and no distress  Resp: clear to auscultation bilaterally, normal respiratory effort Cardio: regular rate and rhythm  GI: soft, non-tender; bowel sounds normal; no masses, no organomegaly.  Incision: C/D/I, no erythema, no drainage noted Pelvic: scant blood on pad  Extremities: extremities normal, atraumatic, no cyanosis or edema and Homans sign is negative, no sign of DVT  Discharged Condition: Stable  Disposition: 01-Home or Self Care  Discharge Instructions    Diet - low sodium heart healthy     Complete by:  As directed    Increase activity slowly    Complete by:  As directed      Allergies as of 07/23/2016      Reactions   Penicillins Hives      Medication List    TAKE these medications   acetaminophen 500 MG tablet Commonly known as:  TYLENOL Take 2 tablets (1,000 mg total) by mouth every 6 (six) hours.   ALPRAZolam 0.25 MG tablet Commonly known as:  XANAX Take 0.25 mg by mouth daily as needed for anxiety.   DULoxetine 60 MG capsule Commonly known as:  CYMBALTA Take 60 mg by mouth daily.   ibuprofen 200 MG tablet Commonly known as:  ADVIL,MOTRIN Take 200 mg by mouth every 6 (six) hours as needed. What changed:  Another medication with the same name was added. Make sure you understand how and when to take each.   ibuprofen 600 MG tablet Commonly known as:  ADVIL,MOTRIN Take 1 tablet (600 mg total) by mouth every 6 (six) hours. What changed:  You were already taking a medication with the same name, and this prescription was added. Make sure you understand how and when to take each.   oxyCODONE 5 MG immediate release tablet Commonly known as:  Oxy IR/ROXICODONE Take 1 tablet (5 mg total) by mouth every 4 (four) hours as needed for moderate pain.   polyethylene glycol powder powder Commonly known as:  GLYCOLAX/MIRALAX Take 255 g by mouth daily. Take one cap-full each morning      Follow-up Information    Arriyanna Mersch, Honor Loh, MD Follow up in 2 week(s).   Specialty:  Obstetrics and Gynecology Contact information: [place]  [place] [address] [phone]           Signed:  [place] Attending [facility]

## 2016-07-23 NOTE — Progress Notes (Signed)
Obstetric and Gynecology  HD 4 POD 1  Subjective  Patient doing well, no complaints, tolerating PO intake, tolerating pain with PO meds, ambulating without difficulty, voiding spontaneously.     Denies CP, SOB, F/C, N/V/D, or leg pain.   Objective  97.9  131/76  17  75  100% RA   General: NAD Cardiovascular: RRR, no murmurs Pulmonary: CTAB, normal respiratory effort Abdomen: Benign. Non-tender, +BS, no guarding. Incision: c/d/i, covered in surgical glue Extremities: No erythema or cords, no calf tenderness, with normal peripheral pulses.  Results for Tammy, Gould (MRN [id number]) as of 07/22/2016 11:41  Ref. Range 07/22/2016 06:06  Sodium Latest Ref Range: 135 - 145 mmol/L 138  Potassium Latest Ref Range: 3.5 - 5.1 mmol/L 3.5  Chloride Latest Ref Range: 101 - 111 mmol/L 107  CO2 Latest Ref Range: 22 - 32 mmol/L 25  Glucose Latest Ref Range: 65 - 99 mg/dL 127 (H)  BUN Latest Ref Range: 6 - 20 mg/dL 10  Creatinine Latest Ref Range: 0.44 - 1.00 mg/dL 0.48  Calcium Latest Ref Range: 8.9 - 10.3 mg/dL 8.2 (L)  Anion gap Latest Ref Range: 5 - 15  6  GFR, Est African American Latest Ref Range: >60 mL/min >60  GFR, Est Non African American Latest Ref Range: >60 mL/min >60  WBC Latest Ref Range: 3.6 - 11.0 K/uL 6.7  RBC Latest Ref Range: 3.80 - 5.20 MIL/uL 3.30 (L)  Hemoglobin Latest Ref Range: 12.0 - 16.0 g/dL 10.9 (L)  HCT Latest Ref Range: 35.0 - 47.0 % 30.8 (L)  MCV Latest Ref Range: 80.0 - 100.0 fL 93.5  MCH Latest Ref Range: 26.0 - 34.0 pg 33.0  MCHC Latest Ref Range: 32.0 - 36.0 g/dL 35.3  RDW Latest Ref Range: 11.5 - 14.5 % 13.5  Platelets Latest Ref Range: 150 - 440 K/uL 164    Assessment   66 y.o.  Hospital Day: 5 s/p TAH BSO for ovarian torsion   Plan   1. Postop = doing incredible, needs to ambulate more today 2. Will follow up final pathology 3. Dispo: continue inpatient admission; likely d/c tomorrow 4. Social:  Declines SW consult, says she has enough support  in her community for now.    ----- Larey Days, MD Attending Obstetrician and Gynecologist [facility], Department of [facility]

## 2016-07-24 ENCOUNTER — Encounter: Payer: Self-pay | Admitting: Obstetrics & Gynecology

## 2016-07-25 LAB — SURGICAL PATHOLOGY

## 2016-07-25 LAB — CYTOLOGY - NON PAP

## 2016-07-28 NOTE — Anesthesia Postprocedure Evaluation (Signed)
Anesthesia Post Note  Patient: Tammy Gould  Procedure(s) Performed: Procedure(s) (LRB): HYSTERECTOMY ABDOMINAL (Bilateral) SALPINGO OOPHORECTOMY (Bilateral)  Patient location during evaluation: PACU Anesthesia Type: General Level of consciousness: awake and alert Pain management: pain level controlled Vital Signs Assessment: post-procedure vital signs reviewed and stable Respiratory status: spontaneous breathing, nonlabored ventilation, respiratory function stable and patient connected to nasal cannula oxygen Cardiovascular status: blood pressure returned to baseline and stable Postop Assessment: no signs of nausea or vomiting Anesthetic complications: no     Last Vitals:  Vitals:   07/23/16 0419 07/23/16 0853  BP: 115/70 124/74  Pulse: 63 62  Resp: 20 18  Temp: 36.5 C 36.6 C    Last Pain:  Vitals:   07/23/16 0853  TempSrc: Oral  PainSc:                  Molli Barrows

## 2016-11-30 DIAGNOSIS — E538 Deficiency of other specified B group vitamins: Secondary | ICD-10-CM | POA: Insufficient documentation

## 2016-11-30 HISTORY — DX: Deficiency of other specified B group vitamins: E53.8

## 2017-01-02 DIAGNOSIS — R42 Dizziness and giddiness: Secondary | ICD-10-CM

## 2017-01-02 DIAGNOSIS — C801 Malignant (primary) neoplasm, unspecified: Secondary | ICD-10-CM

## 2017-01-02 HISTORY — DX: Malignant (primary) neoplasm, unspecified: C80.1

## 2017-01-02 HISTORY — DX: Dizziness and giddiness: R42

## 2017-01-09 ENCOUNTER — Emergency Department
Admission: EM | Admit: 2017-01-09 | Discharge: 2017-01-09 | Disposition: A | Discharge status: Home / Self Care | Payer: Medicare Other | Attending: Emergency Medicine | Admitting: Emergency Medicine

## 2017-01-09 ENCOUNTER — Other Ambulatory Visit: Payer: Self-pay

## 2017-01-09 DIAGNOSIS — Z87891 Personal history of nicotine dependence: Secondary | ICD-10-CM | POA: Insufficient documentation

## 2017-01-09 DIAGNOSIS — Z96641 Presence of right artificial hip joint: Secondary | ICD-10-CM | POA: Diagnosis not present

## 2017-01-09 DIAGNOSIS — Z79899 Other long term (current) drug therapy: Secondary | ICD-10-CM | POA: Insufficient documentation

## 2017-01-09 DIAGNOSIS — R42 Dizziness and giddiness: Secondary | ICD-10-CM | POA: Insufficient documentation

## 2017-01-09 DIAGNOSIS — R55 Syncope and collapse: Secondary | ICD-10-CM | POA: Diagnosis present

## 2017-01-09 LAB — BASIC METABOLIC PANEL
Anion gap: 6 (ref 5–15)
BUN: 13 mg/dL (ref 6–20)
CO2: 25 mmol/L (ref 22–32)
CREATININE: 0.6 mg/dL (ref 0.44–1.00)
Calcium: 9.1 mg/dL (ref 8.9–10.3)
Chloride: 105 mmol/L (ref 101–111)
Glucose, Bld: 120 mg/dL — ABNORMAL HIGH (ref 65–99)
Potassium: 3.8 mmol/L (ref 3.5–5.1)
SODIUM: 136 mmol/L (ref 135–145)

## 2017-01-09 LAB — URINALYSIS, COMPLETE (UACMP) WITH MICROSCOPIC
Bacteria, UA: NONE SEEN
Bilirubin Urine: NEGATIVE
GLUCOSE, UA: NEGATIVE mg/dL
Hgb urine dipstick: NEGATIVE
KETONES UR: NEGATIVE mg/dL
LEUKOCYTES UA: NEGATIVE
Nitrite: NEGATIVE
PH: 7 (ref 5.0–8.0)
Protein, ur: NEGATIVE mg/dL
RBC / HPF: NONE SEEN RBC/hpf (ref 0–5)
Specific Gravity, Urine: 1.003 — ABNORMAL LOW (ref 1.005–1.030)
WBC, UA: NONE SEEN WBC/hpf (ref 0–5)

## 2017-01-09 LAB — CBC
HCT: 37.3 % (ref 35.0–47.0)
Hemoglobin: 12.6 g/dL (ref 12.0–16.0)
MCH: 30.6 pg (ref 26.0–34.0)
MCHC: 33.8 g/dL (ref 32.0–36.0)
MCV: 90.5 fL (ref 80.0–100.0)
PLATELETS: 198 10*3/uL (ref 150–440)
RBC: 4.13 MIL/uL (ref 3.80–5.20)
RDW: 14.1 % (ref 11.5–14.5)
WBC: 2.9 10*3/uL — ABNORMAL LOW (ref 3.6–11.0)

## 2017-01-09 LAB — GLUCOSE, CAPILLARY: Glucose-Capillary: 107 mg/dL — ABNORMAL HIGH (ref 65–99)

## 2017-01-09 MED ORDER — MECLIZINE HCL 25 MG PO TABS: 25.0000 mg | ORAL_TABLET | Freq: Three times a day (TID) | ORAL | 1 refills | Status: DC | PRN

## 2017-01-09 MED ORDER — DEXAMETHASONE SODIUM PHOSPHATE 10 MG/ML IJ SOLN
10.0000 mg | Freq: Once | INTRAMUSCULAR | Status: AC
  Administered 2017-01-09: 10 mg via INTRAVENOUS
  Filled 2017-01-09: qty 1

## 2017-01-09 MED ORDER — SODIUM CHLORIDE 0.9 % IV BOLUS (SEPSIS)
1000.0000 mL | Freq: Once | INTRAVENOUS | Status: AC
  Administered 2017-01-09: 1000 mL via INTRAVENOUS

## 2017-01-09 MED ORDER — MECLIZINE HCL 25 MG PO TABS
50.0000 mg | ORAL_TABLET | Freq: Once | ORAL | Status: AC
  Administered 2017-01-09: 50 mg via ORAL
  Filled 2017-01-09: qty 2

## 2017-01-09 NOTE — ED Notes (Signed)
Jinny Blossom, RN aware patient in room and dressing into gown.

## 2017-01-09 NOTE — ED Provider Notes (Signed)
[facility] Emergency Department Provider Note  ____________________________________________  Time seen: Approximately 10:23 AM  I have reviewed the triage vital signs and the nursing notes.   HISTORY  Chief Complaint Near Syncope and Dizziness    HPI Tammy Gould is a 67 y.o. female who complains of dizziness described as room spinning that started suddenly at about 6:00 AM today. She had gotten out of bed and walked around the house and let the dog outside without any difficulty and then suddenly she got so dizzy that she tried to grab onto furniture to steady herself but eventually fell onto her right hip. Did not hit her head, did not lose consciousness, denies any acute pain. No preceding pain headache vision change numbness tingling weakness chest pain shortness breath back pain or abdominal pain. No vomiting or diaphoresis. Symptoms were abrupt onset, lasted for several minutes, then got better. It's been waxing and waning since onset this morning. She has not noticed any aggravating or alleviating factors.     Past Medical History:  Diagnosis Date  . Anxiety   . Arthritis   . Hepatitis B 1972   hep b antigen negative 07/20/2016  . Pre-diabetes   . Prediabetes   . Sleep apnea    uses cpap     Patient Active Problem List   Diagnosis Date Noted  . Ovarian mass, left 07/19/2016     Past Surgical History:  Procedure Laterality Date  . ABDOMINAL HYSTERECTOMY Bilateral 07/21/2016   Procedure: HYSTERECTOMY ABDOMINAL;  Surgeon: Ward, Honor Loh, MD;  Location: [facility] ORS;  Service: Gynecology;  Laterality: Bilateral;  . APPENDECTOMY    . BREAST EXCISIONAL BIOPSY Bilateral 1969   negative  . SALPINGOOPHORECTOMY Bilateral 07/21/2016   Procedure: SALPINGO OOPHORECTOMY;  Surgeon: Ward, Honor Loh, MD;  Location: [facility] ORS;  Service: Gynecology;  Laterality: Bilateral;  . TONSILLECTOMY    . TOTAL HIP ARTHROPLASTY Right 2015  . WRIST SURGERY Right 2012   plate and screw in wrist     Prior to Admission medications   Medication Sig Start Date End Date Taking? Authorizing Provider  acetaminophen (TYLENOL) 500 MG tablet Take 2 tablets (1,000 mg total) by mouth every 6 (six) hours. 07/23/16  Yes Ward, Honor Loh, MD  DULoxetine (CYMBALTA) 60 MG capsule Take 60 mg by mouth daily.   Yes [provider]  ibuprofen (ADVIL,MOTRIN) 600 MG tablet Take 1 tablet (600 mg total) by mouth every 6 (six) hours. 07/23/16  Yes Ward, Honor Loh, MD  vitamin B-12 (CYANOCOBALAMIN) 1000 MCG tablet Take 1,000 mcg by mouth daily.   Yes [provider]  ALPRAZolam (XANAX) 0.25 MG tablet Take 0.25 mg by mouth daily as needed for anxiety.    [provider]  meclizine (ANTIVERT) 25 MG tablet Take 1 tablet (25 mg total) by mouth 3 (three) times daily as needed for dizziness or nausea. 01/09/17   Carrie Mew, MD  oxyCODONE (OXY IR/ROXICODONE) 5 MG immediate release tablet Take 1 tablet (5 mg total) by mouth every 4 (four) hours as needed for moderate pain. Patient not taking: Reported on 01/09/2017 07/23/16   Ward, Honor Loh, MD  polyethylene glycol powder (GLYCOLAX/MIRALAX) powder Take 255 g by mouth daily. Take one cap-full each morning 07/23/16   Ward, Honor Loh, MD     Allergies Penicillins   Family History  Problem Relation Age of Onset  . Breast cancer Paternal Grandmother 69    Social History Social History   Tobacco Use  .  Smoking status: Former Smoker    Types: Cigarettes    Last attempt to quit: 01/02/1986    Years since quitting: 31.0  . Smokeless tobacco: Never Used  Substance Use Topics  . Alcohol use: Yes    Comment: occasionally]  . Drug use: No    Review of Systems  Constitutional:   No fever or chills.  ENT:   No sore throat. No rhinorrhea. Cardiovascular:   No chest pain or syncope. Respiratory:   No dyspnea or cough. Gastrointestinal:   Negative for abdominal pain, vomiting and diarrhea.  Musculoskeletal:    Negative for focal pain or swelling All other systems reviewed and are negative except as documented above in ROS and HPI.  ____________________________________________   PHYSICAL EXAM:  VITAL SIGNS: ED Triage Vitals [01/09/17 0719]  Enc Vitals Group     BP (!) 165/101     Pulse Rate 70     Resp 16     Temp 97.8 F (36.6 C)     Temp Source Oral     SpO2 100 %     Weight 170 lb (77.1 kg)     Height      Head Circumference      Peak Flow      Pain Score      Pain Loc      Pain Edu?      Excl. in [place]?     Vital signs reviewed, nursing assessments reviewed.   Constitutional:   Alert and oriented. Well appearing and in no distress. Eyes:   No scleral icterus.  EOMI. No nystagmus. No conjunctival pallor. PERRL. ENT   Head:   Normocephalic and atraumatic. TMs normal bilaterally. External canals normal   Nose:   No congestion/rhinnorhea.    Mouth/Throat:   MMM, no pharyngeal erythema. No peritonsillar mass.    Neck:   No meningismus. Full ROM. No midline tenderness. No bruit Hematological/Lymphatic/Immunilogical:   No cervical lymphadenopathy. Cardiovascular:   RRR. Symmetric bilateral radial and DP pulses.  No murmurs.  Respiratory:   Normal respiratory effort without tachypnea/retractions. Breath sounds are clear and equal bilaterally. No wheezes/rales/rhonchi. Gastrointestinal:   Soft and nontender. Non distended. There is no CVA tenderness.  No rebound, rigidity, or guarding. Genitourinary:   deferred Musculoskeletal:   Normal range of motion in all extremities. No joint effusions.  No lower extremity tenderness.  No edema. Neurologic:   Normal speech and language.  Motor grossly intact. Normal gait. Normal finger to nose. No pronator drift Positive dix-Hallpike maneuver to the left, quickly reproducing her symptoms which are aggravated with any change in position with head turn to the left. No gross focal neurologic deficits are appreciated.  Skin:    Skin is  warm, dry and intact. No rash noted.  No petechiae, purpura, or bullae.  ____________________________________________    LABS (pertinent positives/negatives) (all labs ordered are listed, but only abnormal results are displayed) Labs Reviewed  BASIC METABOLIC PANEL - Abnormal; Notable for the following components:      Result Value   Glucose, Bld 120 (*)    All other components within normal limits  CBC - Abnormal; Notable for the following components:   WBC 2.9 (*)    All other components within normal limits  URINALYSIS, COMPLETE (UACMP) WITH MICROSCOPIC - Abnormal; Notable for the following components:   Color, Urine STRAW (*)    APPearance CLEAR (*)    Specific Gravity, Urine 1.003 (*)    Squamous Epithelial / LPF 0-5 (*)  All other components within normal limits  GLUCOSE, CAPILLARY - Abnormal; Notable for the following components:   Glucose-Capillary 107 (*)    All other components within normal limits  CBG MONITORING, ED   ____________________________________________   EKG  Interpreted by me Normal sinus rhythm rate of 73, normal axis and intervals. Normal QRS ST segments and T waves.  ____________________________________________    RADIOLOGY  No results found.  ____________________________________________   PROCEDURES Procedures  ____________________________________________    CLINICAL IMPRESSION / ASSESSMENT AND PLAN / ED COURSE  Pertinent labs & imaging results that were available during my care of the patient were reviewed by me and considered in my medical decision making (see chart for details).     Clinical Course as of Jan 09 1226  Tue Jan 09, 2017  0749 Pt p/w peripheral vertigo, reproducible with dix-hallpike leftward. Considering the patient's symptoms, medical history, and physical examination today, I have low suspicion for ischemic stroke, intracranial hemorrhage, meningitis, encephalitis, carotid or vertebral dissection, venous  sinus thrombosis, MS, intracranial hypertension, glaucoma, CRAO, CRVO, or temporal arteritis. Ivf, meclizine, decadron. Neuroimaging not warranted at this time.   [PS]    Clinical Course User Index [PS] Carrie Mew, MD     ----------------------------------------- 12:27 PM on 01/09/2017 -----------------------------------------  Patient continues to feel improved. Able to turn head and change position without worsening of her symptoms. Sitting upright and tolerating oral intake. Urinalysis negative. Discharge home with meclizine, follow-up with primary care. She understands to return if her symptoms worsen and will follow-up with her doctor.  ____________________________________________   FINAL CLINICAL IMPRESSION(S) / ED DIAGNOSES    Final diagnoses:  Vertigo  Dizziness       Portions of this note were generated with dragon dictation software. Dictation errors may occur despite best attempts at proofreading.    Carrie Mew, MD 01/09/17 1228

## 2017-01-09 NOTE — ED Notes (Signed)
Pt assisted to the toilet by this RN. Pt tolerated well. C/o return of dizziness upon reaching the toilet. UA collected. Will continue to monitor for further patient needs.

## 2017-01-09 NOTE — ED Triage Notes (Addendum)
Pt awoke this AM, felt normal; had episode of dizziness that caused her to fall at approx 0610. Dizziness continues. Denies any injury or LOC. Pt reports recent sinusitis. Pt alert and oriented X4, active, cooperative, pt in NAD. RR even and unlabored, color WNL.  "the room is spinning"

## 2017-05-17 ENCOUNTER — Other Ambulatory Visit: Payer: Self-pay | Admitting: Obstetrics and Gynecology

## 2017-05-17 DIAGNOSIS — Z1231 Encounter for screening mammogram for malignant neoplasm of breast: Secondary | ICD-10-CM

## 2017-06-25 ENCOUNTER — Ambulatory Visit
Admission: RE | Admit: 2017-06-25 | Discharge: 2017-06-25 | Disposition: A | Discharge status: Home / Self Care | Payer: Medicare Other | Source: Ambulatory Visit | Attending: Obstetrics and Gynecology | Admitting: Obstetrics and Gynecology

## 2017-06-25 DIAGNOSIS — Z1231 Encounter for screening mammogram for malignant neoplasm of breast: Secondary | ICD-10-CM | POA: Insufficient documentation

## 2017-06-25 HISTORY — DX: Malignant (primary) neoplasm, unspecified: C80.1

## 2017-11-02 ENCOUNTER — Other Ambulatory Visit: Payer: Self-pay | Admitting: Surgery

## 2017-11-02 DIAGNOSIS — K432 Incisional hernia without obstruction or gangrene: Secondary | ICD-10-CM

## 2017-11-06 ENCOUNTER — Ambulatory Visit
Admission: RE | Admit: 2017-11-06 | Discharge: 2017-11-06 | Disposition: A | Discharge status: Home / Self Care | Payer: Medicare Other | Source: Ambulatory Visit | Attending: Surgery | Admitting: Surgery

## 2017-11-06 DIAGNOSIS — Z90710 Acquired absence of both cervix and uterus: Secondary | ICD-10-CM | POA: Insufficient documentation

## 2017-11-06 DIAGNOSIS — K8020 Calculus of gallbladder without cholecystitis without obstruction: Secondary | ICD-10-CM | POA: Insufficient documentation

## 2017-11-06 DIAGNOSIS — Q6211 Congenital occlusion of ureteropelvic junction: Secondary | ICD-10-CM | POA: Diagnosis not present

## 2017-11-06 DIAGNOSIS — K5730 Diverticulosis of large intestine without perforation or abscess without bleeding: Secondary | ICD-10-CM | POA: Insufficient documentation

## 2017-11-06 DIAGNOSIS — K429 Umbilical hernia without obstruction or gangrene: Secondary | ICD-10-CM | POA: Insufficient documentation

## 2017-11-06 DIAGNOSIS — M6208 Separation of muscle (nontraumatic), other site: Secondary | ICD-10-CM | POA: Diagnosis not present

## 2017-11-06 DIAGNOSIS — K432 Incisional hernia without obstruction or gangrene: Secondary | ICD-10-CM

## 2017-11-06 DIAGNOSIS — I700 Atherosclerosis of aorta: Secondary | ICD-10-CM | POA: Diagnosis not present

## 2017-12-24 ENCOUNTER — Other Ambulatory Visit: Payer: Self-pay

## 2017-12-24 ENCOUNTER — Encounter
Admission: RE | Admit: 2017-12-24 | Discharge: 2017-12-24 | Disposition: A | Discharge status: Home / Self Care | Payer: Medicare Other | Source: Ambulatory Visit | Attending: Surgery | Admitting: Surgery

## 2017-12-24 ENCOUNTER — Ambulatory Visit: Payer: Self-pay | Admitting: Surgery

## 2017-12-24 HISTORY — DX: Dizziness and giddiness: R42

## 2017-12-24 NOTE — H&P (View-Only) (Signed)
Subjective:   CC: incisional hernia  HPI:  Tammy Gould is a 67 y.o. female who returns to clinic to discuss surgical repair of incision hernia.  No change in appearence but  States it is becoming more uncomfortable, especially with exertion.  Still soft, no changes in BM or urinary habits.  Past Medical History:  has a past medical history of Allergy (1966), Arthritis, Cervical disc disease (05/05/2013), Depression, Diverticulosis, History of hepatitis B (1972), Hyperlipidemia (05/05/2013), Lumbar disc disease (05/05/2013), OSA (obstructive sleep apnea) (05/05/2013), and Urinary incontinence.  Past Surgical History:       Past Surgical History:  Procedure Laterality Date  . ABDOMINAL HYSTERECTOMY  07/2016   Dr Vikki Ports Ward  . APPENDECTOMY    . Benign Breast Cysts removed bilaterally    . ENDOSCOPIC CARPAL TUNNEL RELEASE Bilateral   . FRACTURE SURGERY  04/2012   Right wrist  . HYSTERECTOMY  08/2016  . JOINT REPLACEMENT  02/19/2014   Hip replacement  . KNEE ARTHROSCOPY Left   . left knee surgery 2010    . Right total hip replacement Right [id number]  . TONSILLECTOMY    . TUBAL LIGATION  1976    Family History: family history includes Alzheimer's disease in her brother, maternal aunt, maternal aunt, maternal uncle, maternal uncle, maternal uncle, and paternal grandmother; Aneurysm in her mother; Bipolar disorder in her brother and son; Breast cancer in her maternal aunt and paternal grandmother; Coronary Artery Disease (Blocked arteries around heart) in her father; High blood pressure (Hypertension) in her father and mother; Osteoporosis (Thinning of bones) in her father and mother; Prostate cancer in her father.  Social History:  reports that she quit smoking about 31 years ago. She started smoking about 47 years ago. She has a 15.00 pack-year smoking history. She has never used smokeless tobacco. She reports current alcohol use. She reports that she does not use  drugs.  Current Medications: has a current medication list which includes the following prescription(s): acetaminophen, alprazolam, cyanocobalamin, duloxetine, ibuprofen, and acetaminophen.  Allergies:       Allergies as of 12/24/2017 - Reviewed 12/24/2017  Allergen Reaction Noted  . Poison ivy [vit e-nonoxynol 9-aloe vera] Rash 05/02/2016  . Penicillins Rash and Hives 05/05/2013    ROS:  General: Denies weight loss, weight gain, fatigue, fevers, chills, and night sweats. Heart: Denies chest pain, palpitations, racing heart, irregular heartbeat, leg pain or swelling, and decreased activity tolerance. Respiratory: Denies breathing difficulty, shortness of breath, wheezing, cough, and sputum. GI: Denies change in appetite, heartburn, nausea, vomiting, constipation, diarrhea, and blood in stool. GU: Denies difficulty urinating, pain with urinating, urgency, frequency, blood in urine, and heavy menstrual bleeding.   Objective:   BP 132/83   Pulse 81   Temp 36.3 C (97.4 F) (Oral)   Ht 160 cm (5\' 3" )   Wt 81.6 kg (179 lb 14.3 oz)   BMI 31.87 kg/m   Constitutional :  alert, appears stated age, cooperative and no distress  Lymphatics/Throat:  no asymmetry, masses, or scars  Respiratory:  clear to auscultation bilaterally  Cardiovascular:  regular rate and rhythm  Gastrointestinal: soft, non-tender; bowel sounds normal; no masses,  no organomegaly. incisional hernia noted.  moderate, reducible with diastasis recti  Musculoskeletal: Steady gait and movement  Skin: Cool and moist, visible surgical scars infraumbilical from previous hysterectomy  Psychiatric: Normal affect, non-agitated, not confused       LABS:  n/a   RADS: n/a Assessment:       Incisional  hernia, without obstruction or gangrene [K43.2]  Plan:   1. Incisional hernia, without obstruction or gangrene [K43.2]   Discussed the risk of surgery including recurrence, which can be up to 50% in the case  of incisional or complex hernias, possible use of prosthetic materials (mesh) and the increased risk of mesh infxn if used, bleeding, chronic pain, post-op infxn, post-op SBO or ileus, and possible re-operation to address said risks. The risks of general anesthetic, if used, includes MI, CVA, sudden death or even reaction to anesthetic medications also discussed. Alternatives include continued observation.  Benefits include possible symptom relief, prevention of incarceration, strangulation, enlargement in size over time, and the risk of emergency surgery in the face of strangulation.   Typical post-op recovery time of 3-5 days with 4-6 weeks of activity restrictions were also discussed.  ED return precautions given for sudden increase in pain, size of hernia with accompanying fever, nausea, and/or vomiting.  The patient verbalized understanding and all questions were answered to the patient's satisfaction.   2. Patient has elected to proceed with laparoscopic surgical treatment. Procedure will be scheduled.  Written consent will be obtained.  Deemed low risk by primary.

## 2017-12-24 NOTE — H&P (Signed)
Subjective:   CC: incisional hernia  HPI:  Tammy Gould is a 67 y.o. female who returns to clinic to discuss surgical repair of incision hernia.  No change in appearence but  States it is becoming more uncomfortable, especially with exertion.  Still soft, no changes in BM or urinary habits.  Past Medical History:  has a past medical history of Allergy (1966), Arthritis, Cervical disc disease (05/05/2013), Depression, Diverticulosis, History of hepatitis B (1972), Hyperlipidemia (05/05/2013), Lumbar disc disease (05/05/2013), OSA (obstructive sleep apnea) (05/05/2013), and Urinary incontinence.  Past Surgical History:       Past Surgical History:  Procedure Laterality Date  . ABDOMINAL HYSTERECTOMY  07/2016   Dr Vikki Ports Ward  . APPENDECTOMY    . Benign Breast Cysts removed bilaterally    . ENDOSCOPIC CARPAL TUNNEL RELEASE Bilateral   . FRACTURE SURGERY  04/2012   Right wrist  . HYSTERECTOMY  08/2016  . JOINT REPLACEMENT  02/19/2014   Hip replacement  . KNEE ARTHROSCOPY Left   . left knee surgery 2010    . Right total hip replacement Right [id number]  . TONSILLECTOMY    . TUBAL LIGATION  1976    Family History: family history includes Alzheimer's disease in her brother, maternal aunt, maternal aunt, maternal uncle, maternal uncle, maternal uncle, and paternal grandmother; Aneurysm in her mother; Bipolar disorder in her brother and son; Breast cancer in her maternal aunt and paternal grandmother; Coronary Artery Disease (Blocked arteries around heart) in her father; High blood pressure (Hypertension) in her father and mother; Osteoporosis (Thinning of bones) in her father and mother; Prostate cancer in her father.  Social History:  reports that she quit smoking about 31 years ago. She started smoking about 47 years ago. She has a 15.00 pack-year smoking history. She has never used smokeless tobacco. She reports current alcohol use. She reports that she does not use  drugs.  Current Medications: has a current medication list which includes the following prescription(s): acetaminophen, alprazolam, cyanocobalamin, duloxetine, ibuprofen, and acetaminophen.  Allergies:       Allergies as of 12/24/2017 - Reviewed 12/24/2017  Allergen Reaction Noted  . Poison ivy [vit e-nonoxynol 9-aloe vera] Rash 05/02/2016  . Penicillins Rash and Hives 05/05/2013    ROS:  General: Denies weight loss, weight gain, fatigue, fevers, chills, and night sweats. Heart: Denies chest pain, palpitations, racing heart, irregular heartbeat, leg pain or swelling, and decreased activity tolerance. Respiratory: Denies breathing difficulty, shortness of breath, wheezing, cough, and sputum. GI: Denies change in appetite, heartburn, nausea, vomiting, constipation, diarrhea, and blood in stool. GU: Denies difficulty urinating, pain with urinating, urgency, frequency, blood in urine, and heavy menstrual bleeding.   Objective:   BP 132/83   Pulse 81   Temp 36.3 C (97.4 F) (Oral)   Ht 160 cm (5\' 3" )   Wt 81.6 kg (179 lb 14.3 oz)   BMI 31.87 kg/m   Constitutional :  alert, appears stated age, cooperative and no distress  Lymphatics/Throat:  no asymmetry, masses, or scars  Respiratory:  clear to auscultation bilaterally  Cardiovascular:  regular rate and rhythm  Gastrointestinal: soft, non-tender; bowel sounds normal; no masses,  no organomegaly. incisional hernia noted.  moderate, reducible with diastasis recti  Musculoskeletal: Steady gait and movement  Skin: Cool and moist, visible surgical scars infraumbilical from previous hysterectomy  Psychiatric: Normal affect, non-agitated, not confused       LABS:  n/a   RADS: n/a Assessment:       Incisional  hernia, without obstruction or gangrene [K43.2]  Plan:   1. Incisional hernia, without obstruction or gangrene [K43.2]   Discussed the risk of surgery including recurrence, which can be up to 50% in the case  of incisional or complex hernias, possible use of prosthetic materials (mesh) and the increased risk of mesh infxn if used, bleeding, chronic pain, post-op infxn, post-op SBO or ileus, and possible re-operation to address said risks. The risks of general anesthetic, if used, includes MI, CVA, sudden death or even reaction to anesthetic medications also discussed. Alternatives include continued observation.  Benefits include possible symptom relief, prevention of incarceration, strangulation, enlargement in size over time, and the risk of emergency surgery in the face of strangulation.   Typical post-op recovery time of 3-5 days with 4-6 weeks of activity restrictions were also discussed.  ED return precautions given for sudden increase in pain, size of hernia with accompanying fever, nausea, and/or vomiting.  The patient verbalized understanding and all questions were answered to the patient's satisfaction.   2. Patient has elected to proceed with laparoscopic surgical treatment. Procedure will be scheduled.  Written consent will be obtained.  Deemed low risk by primary.

## 2017-12-24 NOTE — Patient Instructions (Addendum)
Your procedure is scheduled on: 12/27/17 Thurs Report to Same Day Surgery 2nd floor medical mall [facility] Entrance-take elevator on left to 2nd floor.  Check in with surgery information desk.) To find out your arrival time please call [phone] between 1PM - 3PM on 12/25/17 Tues  Remember: Instructions that are not followed completely may result in serious medical risk, up to and including death, or upon the discretion of your surgeon and anesthesiologist your surgery may need to be rescheduled.    _x___ 1. Do not eat food after midnight the night before your procedure. You may drink clear liquids up to 2 hours before you are scheduled to arrive at the hospital for your procedure.  Do not drink clear liquids within 2 hours of your scheduled arrival to the hospital.  Clear liquids include  --Water or Apple juice without pulp  --Clear carbohydrate beverage such as ClearFast or Gatorade  --Black Coffee or Clear Tea (No milk, no creamers, do not add anything to                  the coffee or Tea Type 1 and type 2 diabetics should only drink water.   ____Ensure clear carbohydrate drink on the way to the hospital for bariatric patients  ____Ensure clear carbohydrate drink 3 hours before surgery for Dr Dwyane Luo patients if physician instructed.   No gum chewing or hard candies.     __x__ 2. No Alcohol for 24 hours before or after surgery.   __x__3. No Smoking or e-cigarettes for 24 prior to surgery.  Do not use any chewable tobacco products for at least 6 hour prior to surgery   ____  4. Bring all medications with you on the day of surgery if instructed.    __x__ 5. Notify your doctor if there is any change in your medical condition     (cold, fever, infections).    x___6. On the morning of surgery brush your teeth with toothpaste and water.  You may rinse your mouth with mouth wash if you wish.  Do not swallow any toothpaste or mouthwash.   Do not wear jewelry, make-up,  hairpins, clips or nail polish.  Do not wear lotions, powders, or perfumes. You may wear deodorant.  Do not shave 48 hours prior to surgery. Men may shave face and neck.  Do not bring valuables to the hospital.    [facility] is not responsible for any belongings or valuables.               Contacts, dentures or bridgework may not be worn into surgery.  Leave your suitcase in the car. After surgery it may be brought to your room.  For patients admitted to the hospital, discharge time is determined by your                       treatment team.  _  Patients discharged the day of surgery will not be allowed to drive home.  You will need someone to drive you home and stay with you the night of your procedure.    Please read over the following fact sheets that you were given:   [facility] Preparing for Surgery and or MRSA Information   _x___ Take anti-hypertensive listed below, cardiac, seizure, asthma,     anti-reflux and psychiatric medicines. These include:  1. DULoxetine (CYMBALTA) 60 MG capsule  2.  3.  4.  5.  6.  ____Fleets enema or  Magnesium Citrate as directed.   _x___ Use CHG Soap or sage wipes as directed on instruction sheet   ____ Use inhalers on the day of surgery and bring to hospital day of surgery  ____ Stop Metformin and Janumet 2 days prior to surgery.    ____ Take 1/2 of usual insulin dose the night before surgery and none on the morning     surgery.   _x___ Follow recommendations from Cardiologist, Pulmonologist or PCP regarding          stopping Aspirin, Coumadin, Plavix ,Eliquis, Effient, or Pradaxa, and Pletal.  X____Stop Anti-inflammatories such as Advil, Aleve, Ibuprofen, Motrin, Naproxen, Naprosyn, Goodies powders or aspirin products. OK to take Tylenol and                          Celebrex.   _x___ Stop supplements until after surgery.  But may continue Vitamin D, Vitamin B,       and multivitamin.   _x___ Bring C-Pap to the hospital.

## 2017-12-26 MED ORDER — CLINDAMYCIN PHOSPHATE 900 MG/50ML IV SOLN
900.0000 mg | INTRAVENOUS | Status: AC
  Administered 2017-12-27: 900 mg via INTRAVENOUS

## 2017-12-27 ENCOUNTER — Ambulatory Visit: Payer: Medicare Other | Admitting: Anesthesiology

## 2017-12-27 ENCOUNTER — Encounter: Payer: Self-pay | Admitting: Anesthesiology

## 2017-12-27 ENCOUNTER — Other Ambulatory Visit: Payer: Self-pay

## 2017-12-27 ENCOUNTER — Ambulatory Visit
Admission: RE | Admit: 2017-12-27 | Discharge: 2017-12-27 | Disposition: A | Discharge status: Home / Self Care | Payer: Medicare Other | Attending: Surgery | Admitting: Surgery

## 2017-12-27 ENCOUNTER — Encounter
Admission: RE | Disposition: A | Discharge status: Home / Self Care | Payer: Self-pay | Source: Home / Self Care | Attending: Surgery

## 2017-12-27 DIAGNOSIS — F329 Major depressive disorder, single episode, unspecified: Secondary | ICD-10-CM | POA: Insufficient documentation

## 2017-12-27 DIAGNOSIS — Z79899 Other long term (current) drug therapy: Secondary | ICD-10-CM | POA: Insufficient documentation

## 2017-12-27 DIAGNOSIS — G4733 Obstructive sleep apnea (adult) (pediatric): Secondary | ICD-10-CM | POA: Diagnosis not present

## 2017-12-27 DIAGNOSIS — M6208 Separation of muscle (nontraumatic), other site: Secondary | ICD-10-CM | POA: Diagnosis not present

## 2017-12-27 DIAGNOSIS — Z87891 Personal history of nicotine dependence: Secondary | ICD-10-CM | POA: Diagnosis not present

## 2017-12-27 DIAGNOSIS — Z9989 Dependence on other enabling machines and devices: Secondary | ICD-10-CM | POA: Diagnosis not present

## 2017-12-27 DIAGNOSIS — Z96641 Presence of right artificial hip joint: Secondary | ICD-10-CM | POA: Insufficient documentation

## 2017-12-27 DIAGNOSIS — K432 Incisional hernia without obstruction or gangrene: Secondary | ICD-10-CM | POA: Diagnosis present

## 2017-12-27 DIAGNOSIS — Z791 Long term (current) use of non-steroidal anti-inflammatories (NSAID): Secondary | ICD-10-CM | POA: Insufficient documentation

## 2017-12-27 DIAGNOSIS — Z01810 Encounter for preprocedural cardiovascular examination: Secondary | ICD-10-CM

## 2017-12-27 HISTORY — PX: VENTRAL HERNIA REPAIR: SHX424

## 2017-12-27 LAB — GLUCOSE, CAPILLARY: GLUCOSE-CAPILLARY: 97 mg/dL (ref 70–99)

## 2017-12-27 SURGERY — REPAIR, HERNIA, VENTRAL, LAPAROSCOPIC
Anesthesia: General

## 2017-12-27 MED ORDER — DEXAMETHASONE SODIUM PHOSPHATE 10 MG/ML IJ SOLN
INTRAMUSCULAR | Status: AC
  Filled 2017-12-27: qty 1

## 2017-12-27 MED ORDER — ACETAMINOPHEN 325 MG PO TABS: 650.0000 mg | ORAL_TABLET | Freq: Three times a day (TID) | ORAL | 0 refills | Status: AC | PRN

## 2017-12-27 MED ORDER — IBUPROFEN 800 MG PO TABS: 800.0000 mg | ORAL_TABLET | Freq: Three times a day (TID) | ORAL | 0 refills | Status: DC | PRN

## 2017-12-27 MED ORDER — GABAPENTIN 300 MG PO CAPS
300.0000 mg | ORAL_CAPSULE | ORAL | Status: AC
  Administered 2017-12-27: 300 mg via ORAL

## 2017-12-27 MED ORDER — CELECOXIB 200 MG PO CAPS
ORAL_CAPSULE | ORAL | Status: AC
  Administered 2017-12-27: 200 mg via ORAL
  Filled 2017-12-27: qty 1

## 2017-12-27 MED ORDER — ONDANSETRON HCL 4 MG/2ML IJ SOLN
INTRAMUSCULAR | Status: AC
  Filled 2017-12-27: qty 2

## 2017-12-27 MED ORDER — GABAPENTIN 300 MG PO CAPS
ORAL_CAPSULE | ORAL | Status: AC
  Administered 2017-12-27: 300 mg via ORAL
  Filled 2017-12-27: qty 1

## 2017-12-27 MED ORDER — LACTATED RINGERS IV SOLN
INTRAVENOUS | Status: DC | PRN
  Administered 2017-12-27: 14:00:00 via INTRAVENOUS

## 2017-12-27 MED ORDER — CHLORHEXIDINE GLUCONATE CLOTH 2 % EX PADS: 6.0000 | MEDICATED_PAD | Freq: Once | CUTANEOUS | Status: DC

## 2017-12-27 MED ORDER — PROPOFOL 10 MG/ML IV BOLUS
INTRAVENOUS | Status: DC | PRN
  Administered 2017-12-27: 150 mg via INTRAVENOUS

## 2017-12-27 MED ORDER — SUGAMMADEX SODIUM 200 MG/2ML IV SOLN
INTRAVENOUS | Status: DC | PRN
  Administered 2017-12-27: 200 mg via INTRAVENOUS

## 2017-12-27 MED ORDER — PROPOFOL 10 MG/ML IV BOLUS
INTRAVENOUS | Status: AC
  Filled 2017-12-27: qty 20

## 2017-12-27 MED ORDER — EPHEDRINE SULFATE 50 MG/ML IJ SOLN
INTRAMUSCULAR | Status: AC
  Filled 2017-12-27: qty 1

## 2017-12-27 MED ORDER — CLINDAMYCIN PHOSPHATE 900 MG/50ML IV SOLN
INTRAVENOUS | Status: AC
  Filled 2017-12-27: qty 50

## 2017-12-27 MED ORDER — HYDROCODONE-ACETAMINOPHEN 5-325 MG PO TABS: 1.0000 | ORAL_TABLET | Freq: Four times a day (QID) | ORAL | 0 refills | Status: AC | PRN

## 2017-12-27 MED ORDER — CELECOXIB 200 MG PO CAPS
200.0000 mg | ORAL_CAPSULE | ORAL | Status: AC
  Administered 2017-12-27: 200 mg via ORAL

## 2017-12-27 MED ORDER — ROCURONIUM BROMIDE 50 MG/5ML IV SOLN
INTRAVENOUS | Status: AC
  Filled 2017-12-27: qty 1

## 2017-12-27 MED ORDER — LIDOCAINE HCL (PF) 2 % IJ SOLN
INTRAMUSCULAR | Status: AC
  Filled 2017-12-27: qty 10

## 2017-12-27 MED ORDER — FAMOTIDINE 20 MG PO TABS
20.0000 mg | ORAL_TABLET | Freq: Once | ORAL | Status: AC
  Administered 2017-12-27: 20 mg via ORAL

## 2017-12-27 MED ORDER — FENTANYL CITRATE (PF) 100 MCG/2ML IJ SOLN
INTRAMUSCULAR | Status: AC
  Filled 2017-12-27: qty 2

## 2017-12-27 MED ORDER — PHENYLEPHRINE HCL 10 MG/ML IJ SOLN
INTRAMUSCULAR | Status: AC
  Filled 2017-12-27: qty 1

## 2017-12-27 MED ORDER — SODIUM CHLORIDE 0.9 % IV SOLN
INTRAVENOUS | Status: DC | PRN
  Administered 2017-12-27: 50 mL

## 2017-12-27 MED ORDER — BUPIVACAINE-EPINEPHRINE (PF) 0.25% -1:200000 IJ SOLN
INTRAMUSCULAR | Status: DC | PRN
  Administered 2017-12-27: 30 mL via PERINEURAL

## 2017-12-27 MED ORDER — FAMOTIDINE 20 MG PO TABS
ORAL_TABLET | ORAL | Status: AC
  Administered 2017-12-27: 20 mg via ORAL
  Filled 2017-12-27: qty 1

## 2017-12-27 MED ORDER — ACETAMINOPHEN 500 MG PO TABS
1000.0000 mg | ORAL_TABLET | ORAL | Status: AC
  Administered 2017-12-27: 1000 mg via ORAL

## 2017-12-27 MED ORDER — ONDANSETRON HCL 4 MG/2ML IJ SOLN
INTRAMUSCULAR | Status: DC | PRN
  Administered 2017-12-27: 4 mg via INTRAVENOUS

## 2017-12-27 MED ORDER — EPHEDRINE SULFATE 50 MG/ML IJ SOLN
INTRAMUSCULAR | Status: DC | PRN
  Administered 2017-12-27: 5 mg via INTRAVENOUS

## 2017-12-27 MED ORDER — ACETAMINOPHEN 500 MG PO TABS
ORAL_TABLET | ORAL | Status: AC
  Administered 2017-12-27: 1000 mg via ORAL
  Filled 2017-12-27: qty 2

## 2017-12-27 MED ORDER — FENTANYL CITRATE (PF) 100 MCG/2ML IJ SOLN: 25.0000 ug | INTRAMUSCULAR | Status: DC | PRN

## 2017-12-27 MED ORDER — ROCURONIUM BROMIDE 100 MG/10ML IV SOLN
INTRAVENOUS | Status: DC | PRN
  Administered 2017-12-27: 30 mg via INTRAVENOUS
  Administered 2017-12-27: 10 mg via INTRAVENOUS
  Administered 2017-12-27: 20 mg via INTRAVENOUS

## 2017-12-27 MED ORDER — KETOROLAC TROMETHAMINE 30 MG/ML IJ SOLN
INTRAMUSCULAR | Status: DC | PRN
  Administered 2017-12-27: 15 mg via INTRAVENOUS

## 2017-12-27 MED ORDER — FENTANYL CITRATE (PF) 100 MCG/2ML IJ SOLN
INTRAMUSCULAR | Status: DC | PRN
  Administered 2017-12-27 (×2): 50 ug via INTRAVENOUS

## 2017-12-27 MED ORDER — ONDANSETRON HCL 4 MG/2ML IJ SOLN: 4.0000 mg | Freq: Once | INTRAMUSCULAR | Status: DC | PRN

## 2017-12-27 MED ORDER — SUCCINYLCHOLINE CHLORIDE 20 MG/ML IJ SOLN
INTRAMUSCULAR | Status: AC
  Filled 2017-12-27: qty 1

## 2017-12-27 MED ORDER — DOCUSATE SODIUM 100 MG PO CAPS: 100.0000 mg | ORAL_CAPSULE | Freq: Two times a day (BID) | ORAL | 0 refills | Status: AC | PRN

## 2017-12-27 MED ORDER — LIDOCAINE HCL (CARDIAC) PF 100 MG/5ML IV SOSY
PREFILLED_SYRINGE | INTRAVENOUS | Status: DC | PRN
  Administered 2017-12-27: 100 mg via INTRAVENOUS

## 2017-12-27 MED ORDER — SODIUM CHLORIDE 0.9 % IV SOLN
INTRAVENOUS | Status: DC
  Administered 2017-12-27 (×2): via INTRAVENOUS

## 2017-12-27 MED ORDER — DEXAMETHASONE SODIUM PHOSPHATE 10 MG/ML IJ SOLN
INTRAMUSCULAR | Status: DC | PRN
  Administered 2017-12-27: 8 mg via INTRAVENOUS

## 2017-12-27 MED ORDER — LACTATED RINGERS IV SOLN: INTRAVENOUS | Status: DC | PRN

## 2017-12-27 SURGICAL SUPPLY — 51 items
BINDER ABDOMINAL 12 ML 46-62 (SOFTGOODS) ×3 IMPLANT
BLADE SURG 11 STRL SS SAFETY (MISCELLANEOUS) ×3 IMPLANT
CANISTER SUCT 1200ML W/VALVE (MISCELLANEOUS) ×3 IMPLANT
CANNULA DILATOR  5MM W/SLV (CANNULA)
CANNULA DILATOR 10 W/SLV (CANNULA) IMPLANT
CANNULA DILATOR 10MM W/SLV (CANNULA)
CANNULA DILATOR 5 W/SLV (CANNULA) IMPLANT
CHLORAPREP W/TINT 26ML (MISCELLANEOUS) ×3 IMPLANT
COVER WAND RF STERILE (DRAPES) ×3 IMPLANT
DEFOGGER SCOPE WARMER CLEARIFY (MISCELLANEOUS) ×3 IMPLANT
DERMABOND ADVANCED (GAUZE/BANDAGES/DRESSINGS) ×2
DERMABOND ADVANCED .7 DNX12 (GAUZE/BANDAGES/DRESSINGS) ×1 IMPLANT
DEVICE SECURE STRAP 25 ABSORB (INSTRUMENTS) ×6 IMPLANT
ELECT REM PT RETURN 9FT ADLT (ELECTROSURGICAL) ×3
ELECTRODE REM PT RTRN 9FT ADLT (ELECTROSURGICAL) ×1 IMPLANT
GAUZE SPONGE 4X4 12PLY STRL (GAUZE/BANDAGES/DRESSINGS) IMPLANT
GLOVE BIO SURGEON STRL SZ 6.5 (GLOVE) ×2 IMPLANT
GLOVE BIO SURGEONS STRL SZ 6.5 (GLOVE) ×1
GLOVE BIOGEL PI IND STRL 7.0 (GLOVE) ×1 IMPLANT
GLOVE BIOGEL PI INDICATOR 7.0 (GLOVE) ×2
GOWN STRL REUS W/ TWL LRG LVL3 (GOWN DISPOSABLE) ×1 IMPLANT
GOWN STRL REUS W/TWL LRG LVL3 (GOWN DISPOSABLE) ×2
IRRIGATION STRYKERFLOW (MISCELLANEOUS) IMPLANT
IRRIGATOR STRYKERFLOW (MISCELLANEOUS)
KIT TURNOVER KIT A (KITS) ×3 IMPLANT
LABEL OR SOLS (LABEL) ×3 IMPLANT
MESH VENT LT ST 10.2X15.2CM EL (Mesh General) ×3 IMPLANT
NDL SAFETY ECLIPSE 18X1.5 (NEEDLE) IMPLANT
NEEDLE HYPO 18GX1.5 SHARP (NEEDLE)
NEEDLE HYPO 21X1.5 SAFETY (NEEDLE) IMPLANT
NEEDLE HYPO 22GX1.5 SAFETY (NEEDLE) ×3 IMPLANT
NEEDLE VERESS 14GA 120MM (NEEDLE) ×3 IMPLANT
NS IRRIG 500ML POUR BTL (IV SOLUTION) ×3 IMPLANT
PACK LAP CHOLECYSTECTOMY (MISCELLANEOUS) ×3 IMPLANT
PENCIL ELECTRO HAND CTR (MISCELLANEOUS) ×3 IMPLANT
SCISSORS METZENBAUM CVD 33 (INSTRUMENTS) ×3 IMPLANT
SLEEVE ENDOPATH XCEL 5M (ENDOMECHANICALS) ×3 IMPLANT
SUT MNCRL 4-0 (SUTURE) ×4
SUT MNCRL 4-0 27XMFL (SUTURE) ×2
SUT VIC AB 0 CT1 27 (SUTURE) ×2
SUT VIC AB 0 CT1 27XCR 8 STRN (SUTURE) ×1 IMPLANT
SUT VIC AB 3-0 SH 27 (SUTURE) ×2
SUT VIC AB 3-0 SH 27X BRD (SUTURE) ×1 IMPLANT
SUT VICRYL 0 AB UR-6 (SUTURE) ×3 IMPLANT
SUTURE MNCRL 4-0 27XMF (SUTURE) ×2 IMPLANT
SYR 10ML LL (SYRINGE) ×3 IMPLANT
TOWEL OR 17X26 4PK STRL BLUE (TOWEL DISPOSABLE) ×3 IMPLANT
TROCAR XCEL 12X100 BLDLESS (ENDOMECHANICALS) ×3 IMPLANT
TROCAR XCEL BLUNT TIP 100MML (ENDOMECHANICALS) IMPLANT
TROCAR XCEL NON-BLD 11X100MML (ENDOMECHANICALS) ×3 IMPLANT
TROCAR XCEL NON-BLD 5MMX100MML (ENDOMECHANICALS) ×3 IMPLANT

## 2017-12-27 NOTE — Discharge Instructions (Signed)

## 2017-12-27 NOTE — Anesthesia Post-op Follow-up Note (Signed)
Anesthesia QCDR form completed.        

## 2017-12-27 NOTE — Transfer of Care (Signed)
Immediate Anesthesia Transfer of Care Note  Patient: Tammy Gould  Procedure(s) Performed: LAPAROSCOPIC VENTRAL HERNIA REPAIR WITH MESH (N/A )  Patient Location: PACU  Anesthesia Type:General  Level of Consciousness: awake, alert , oriented and patient cooperative  Airway & Oxygen Therapy: Patient Spontanous Breathing  Post-op Assessment: Report given to RN, Post -op Vital signs reviewed and stable and Patient moving all extremities  Post vital signs: Reviewed and stable  Last Vitals:  Vitals Value Taken Time  BP 163/94 12/27/2017  2:56 PM  Temp 36.2 C 12/27/2017  2:51 PM  Pulse 89 12/27/2017  2:57 PM  Resp 20 12/27/2017  2:57 PM  SpO2 94 % 12/27/2017  2:57 PM  Vitals shown include unvalidated device data.  Last Pain:  Vitals:   12/27/17 1451  TempSrc:   PainSc: 0-No pain      Patients Stated Pain Goal: 0 (83/81/84 0375)  Complications: No apparent anesthesia complications

## 2017-12-27 NOTE — Interval H&P Note (Signed)
History and Physical Interval Note:  12/27/2017 12:39 PM  [place]  has presented today for surgery, with the diagnosis of VENTRAL HERNIA, DIASTASIS RECTI  The various methods of treatment have been discussed with the patient and family. After consideration of risks, benefits and other options for treatment, the patient has consented to  Procedure(s): Harper (N/A) as a surgical intervention .  The patient's history has been reviewed, patient examined, no change in status, stable for surgery.  I have reviewed the patient's chart and labs.  Questions were answered to the patient's satisfaction.     Amilee Janvier Lysle Pearl

## 2017-12-27 NOTE — Op Note (Signed)
Preoperative diagnosis: incisional hernia, reducible Postoperative diagnosis: same  Procedure:  Laparoscopic incisional hernia repair with mesh  Anesthesia: general  Surgeon: Benjamine Sprague Assistant: Cintron-Diaz  Wound Classification: Clean  Specimen: none  Complications: None  Estimated Blood Loss: 8ml  Indications:see HPI  Findings: 1. Swiss cheese incisional hernia with diastasis 4. Tension free repair achieved with 11.3 bard mesh and suture 5. Adequate hemostasis  Description of procedure: The patient was brought to the operating room and general anesthesia was induced. A time-out was completed verifying correct patient, procedure, site, positioning, and implant(s) and/or special equipment prior to beginning this procedure. Antibiotics were administered prior to making the incision. SCDs placed. The anterior abdominal wall was prepped and draped in the standard sterile fashion.   Palmer's point chosen for entry.  Veress needle placed and abdomen insufflated to 15cm without any dramatic increase in pressure.  Needle removed and optiview technique used to place 91mm port at same point.  No injury noted during placement.  Two additional ports, 60mm and 52mm, along left lateral aspect placed.  Hernia contents noted and reduced with combination of blunt, sharp dissection, and ligasure.  Hemostasis achieved throughout this portion.  Once all hernia contents reduced, there was noted to be a swiss cheese incisional hernia, total measuring 4cm x 9cm   Insufflation dropped to 51mm and transfacial suture with 0 vicryl x2 used to primarily close defect under minimal tension. Bard echo plus protected 10 x 15cm mesh was placed within the abdominal cavity through 68mm port and secured to the abdominal wall centered over the defects using two rounds of secure strap.  Any bleeding noted during this portion was no longer actively bleeding by end of securing mesh.   Exparel was infused in a TAP block  prior to desufflation.  The 20mm port site was closed with PMI system.  Abdomen then desufflated while camera within abdomen to ensure no signs of new bleed prior to removing camera and all ports completely. Port sites skin incisions were all closed with 4-0 monocryl in a subcuticular fashion.    All wounds then dressed with Dermabond, including the transfascial suture entry points.  Patient was then successfully awakened and transferred to PACU in stable condition.  Foley placed at beginning of procedure removed.  At the end of the procedure sponge and instrument counts were correct

## 2017-12-27 NOTE — Anesthesia Preprocedure Evaluation (Signed)
Anesthesia Evaluation  Patient identified by MRN, date of birth, ID band Patient awake    Reviewed: Allergy & Precautions, NPO status , Patient's Chart, lab work & pertinent test results, reviewed documented beta blocker date and time   Airway Mallampati: III  TM Distance: >3 FB     Dental  (+) Chipped   Pulmonary sleep apnea and Continuous Positive Airway Pressure Ventilation , former smoker,           Cardiovascular      Neuro/Psych    GI/Hepatic (+) Hepatitis -  Endo/Other    Renal/GU      Musculoskeletal  (+) Arthritis ,   Abdominal   Peds  Hematology   Anesthesia Other Findings Obese. EKG ok.  Reproductive/Obstetrics                             Anesthesia Physical Anesthesia Plan  ASA: III  Anesthesia Plan: General   Post-op Pain Management:    Induction: Intravenous  PONV Risk Score and Plan:   Airway Management Planned: Oral ETT  Additional Equipment:   Intra-op Plan:   Post-operative Plan:   Informed Consent: I have reviewed the patients History and Physical, chart, labs and discussed the procedure including the risks, benefits and alternatives for the proposed anesthesia with the patient or authorized representative who has indicated his/her understanding and acceptance.     Plan Discussed with: CRNA  Anesthesia Plan Comments:         Anesthesia Quick Evaluation

## 2017-12-27 NOTE — Anesthesia Procedure Notes (Signed)
Procedure Name: Intubation Date/Time: 12/27/2017 12:38 PM Performed by: Lowry Bowl, CRNA Pre-anesthesia Checklist: Patient identified, Emergency Drugs available, Suction available and Patient being monitored Patient Re-evaluated:Patient Re-evaluated prior to induction Oxygen Delivery Method: Circle system utilized Preoxygenation: Pre-oxygenation with 100% oxygen Induction Type: IV induction and Cricoid Pressure applied Ventilation: Mask ventilation without difficulty Laryngoscope Size: Mac and 3 Grade View: Grade I Tube type: Oral Tube size: 7.0 mm Number of attempts: 1 Airway Equipment and Method: Stylet Placement Confirmation: ETT inserted through vocal cords under direct vision,  positive ETCO2 and breath sounds checked- equal and bilateral Secured at: 21 cm Tube secured with: Tape Dental Injury: Teeth and Oropharynx as per pre-operative assessment

## 2017-12-28 ENCOUNTER — Encounter: Payer: Self-pay | Admitting: Surgery

## 2017-12-28 NOTE — Anesthesia Postprocedure Evaluation (Signed)
Anesthesia Post Note  Patient: Tammy Gould  Procedure(s) Performed: LAPAROSCOPIC VENTRAL HERNIA REPAIR WITH MESH (N/A )  Patient location during evaluation: PACU Anesthesia Type: General Level of consciousness: awake and alert Pain management: pain level controlled Vital Signs Assessment: post-procedure vital signs reviewed and stable Respiratory status: spontaneous breathing, nonlabored ventilation, respiratory function stable and patient connected to nasal cannula oxygen Cardiovascular status: blood pressure returned to baseline and stable Postop Assessment: no apparent nausea or vomiting Anesthetic complications: no     Last Vitals:  Vitals:   12/27/17 1542 12/27/17 1605  BP: (!) 143/90 (!) 147/89  Pulse: 72 73  Resp: 16 18  Temp: 36.9 C   SpO2: 100% 97%    Last Pain:  Vitals:   12/28/17 0810  TempSrc:   PainSc: [place]

## 2018-05-16 DIAGNOSIS — K5901 Slow transit constipation: Secondary | ICD-10-CM | POA: Insufficient documentation

## 2018-05-16 HISTORY — DX: Slow transit constipation: K59.01

## 2018-05-22 ENCOUNTER — Other Ambulatory Visit: Payer: Self-pay | Admitting: Surgery

## 2018-05-22 DIAGNOSIS — K469 Unspecified abdominal hernia without obstruction or gangrene: Secondary | ICD-10-CM

## 2018-05-24 ENCOUNTER — Ambulatory Visit
Admission: RE | Admit: 2018-05-24 | Discharge: 2018-05-24 | Disposition: A | Discharge status: Home / Self Care | Payer: Medicare Other | Source: Ambulatory Visit | Attending: Surgery | Admitting: Surgery

## 2018-05-24 ENCOUNTER — Other Ambulatory Visit: Payer: Self-pay

## 2018-05-24 DIAGNOSIS — K469 Unspecified abdominal hernia without obstruction or gangrene: Secondary | ICD-10-CM | POA: Diagnosis not present

## 2018-07-02 IMAGING — CT CT ABD-PELV W/ CM
2 of 5 series · 14 of 46 positions shown, 16 images · IV contrast (APPLIED)
Comparison: Pelvic ultrasound from 02/20/2007

ADDENDUM:
The original report was by Dr. Fillemoni Cellestinus. The following
addendum is by Dr. Fillemoni Cellestinus:

Critical Value/emergent results were called by telephone at the time
of interpretation on 07/19/2016 at [DATE] to Dr. YUMBI MARAFA ,
who verbally acknowledged these results. This is considered a
critical value due to the possible ovarian torsion, in this case due
to the very large adnexal mass.
CLINICAL DATA: Right lower quadrant abdominal pain for 2 days.
EXAM:
CT ABDOMEN AND PELVIS WITH CONTRAST
TECHNIQUE: Multidetector CT imaging of the abdomen and pelvis was performed
using the standard protocol following bolus administration of
intravenous contrast.
CONTRAST:  100mL [id number] IOPAMIDOL ([id number]) INJECTION 61%

[Series 2: axial st · axial · 0.86mm/px · z∈[+165,+565]mm · 11 of 90 slices shown, 13 images]
[im 5/90  soft-tissue]
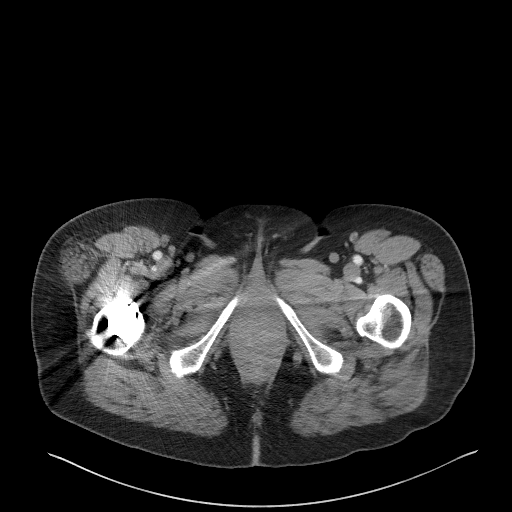
[im 5/90  bone]
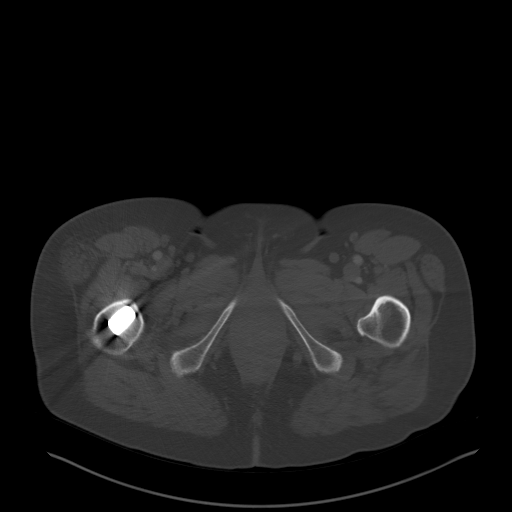
[im 15/90  soft-tissue]
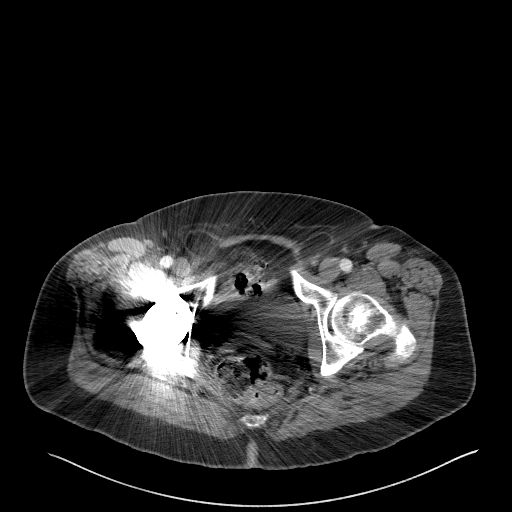
[im 24/90  soft-tissue]
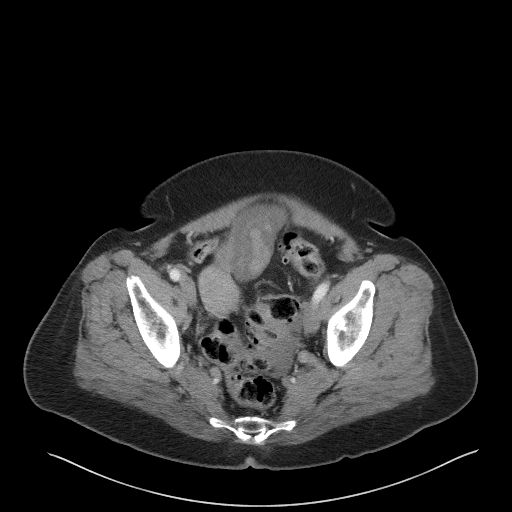
[im 29/90  soft-tissue]
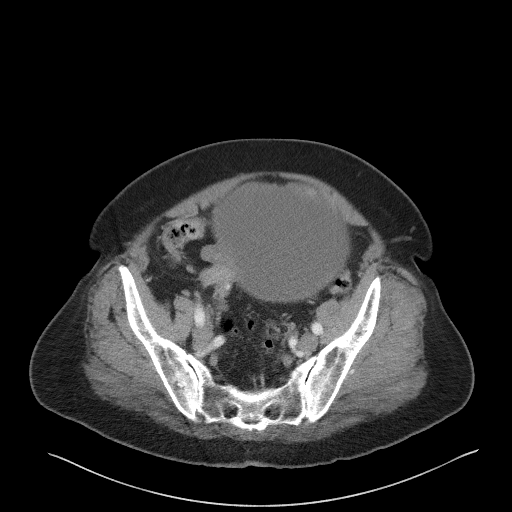
[im 38/90  soft-tissue]
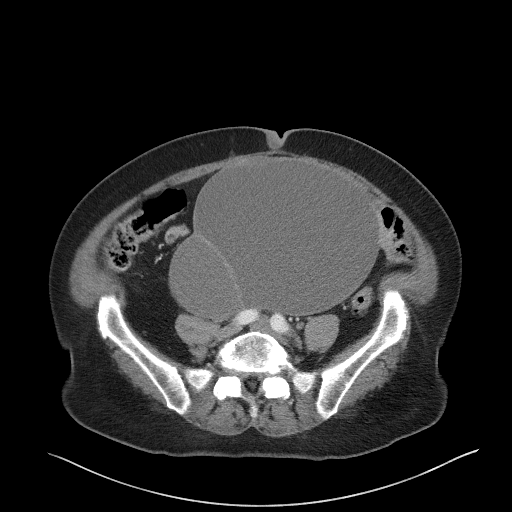
[im 47/90  soft-tissue]
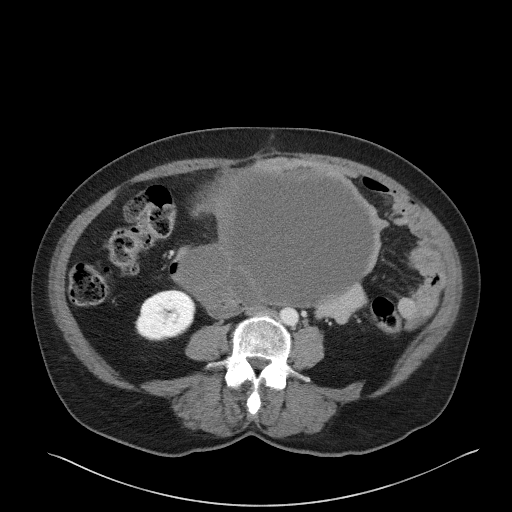
[im 52/90  soft-tissue]
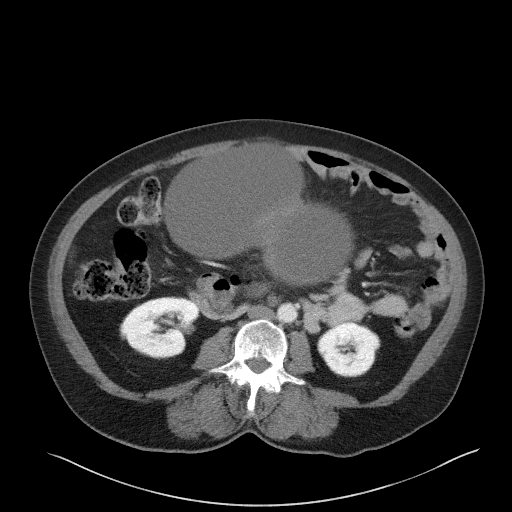
[im 61/90  soft-tissue]
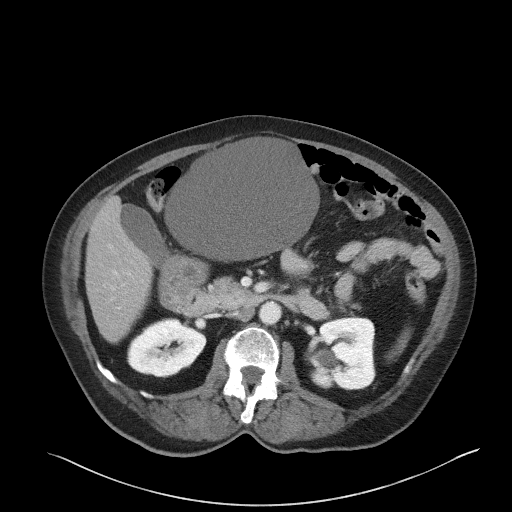
[im 66/90  soft-tissue]
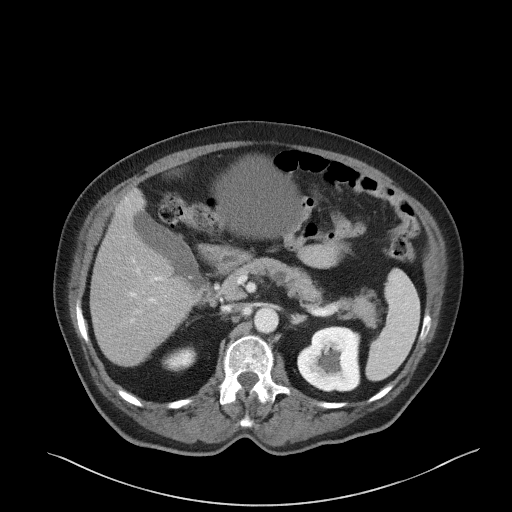
[im 66/90  bone]
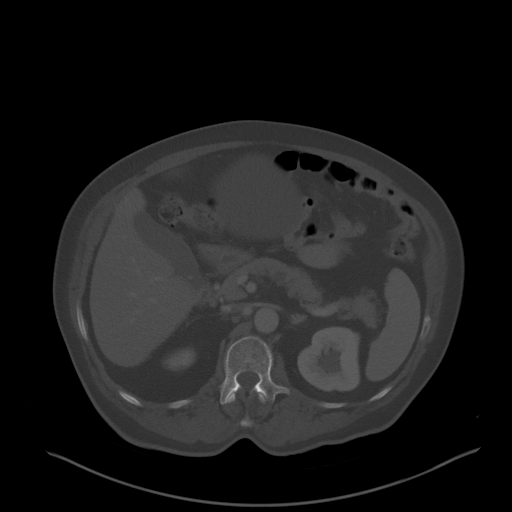
[im 75/90  soft-tissue]
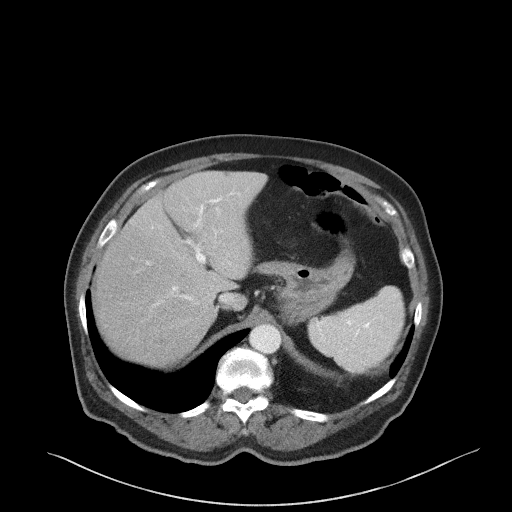
[im 85/90  soft-tissue]
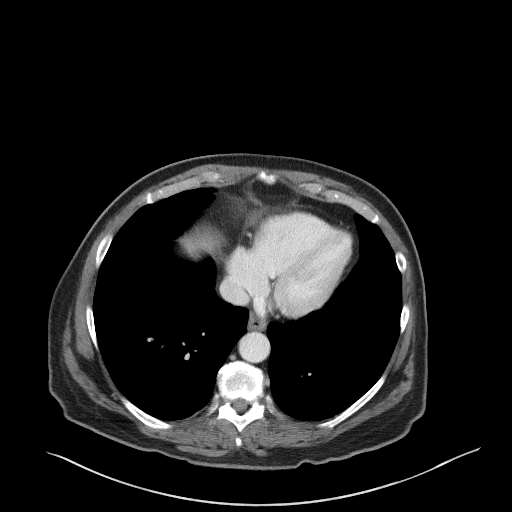

[Series 5: coronal st · coronal · 0.76mm/px · 3 of 95 slices shown]
[im 32/95  soft-tissue]
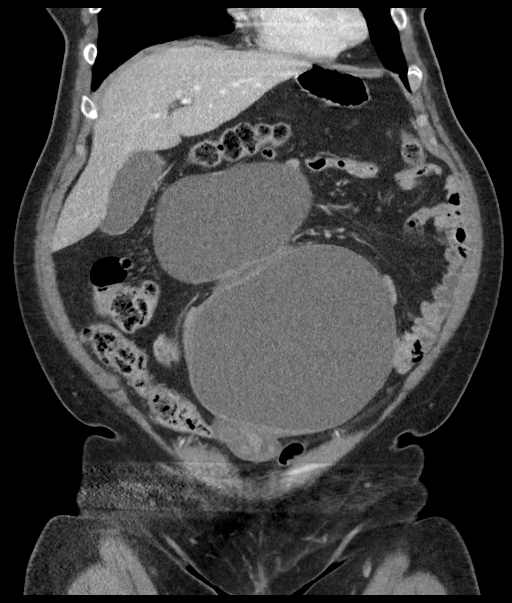
[im 42/95  soft-tissue]
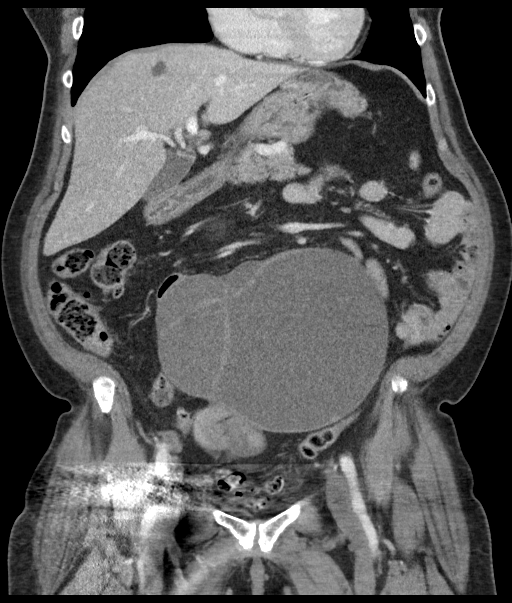
[im 53/95  soft-tissue]
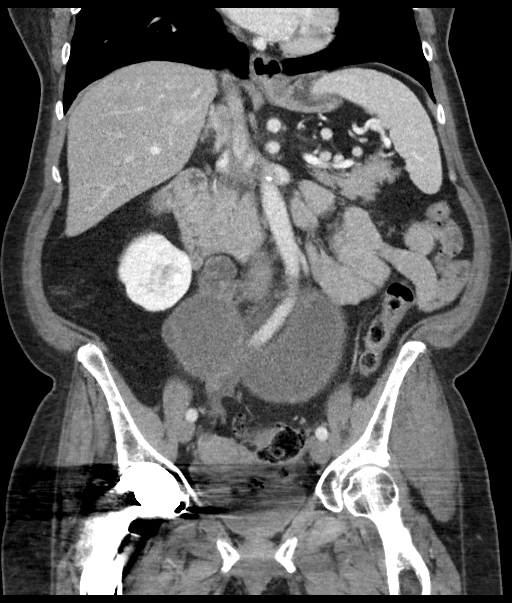

[14 of 46 positions shown; findings below may reference images not displayed]

FINDINGS: Lower chest: Bochdalek hernia containing adipose tissue.

Hepatobiliary: There are approximately 6 small hypodense lesions in
the liver, technically too small to characterize although
statistically likely to be cysts or small hemangiomas. The 2 lesions
in segment 6 remain hypodense on delayed images and accordingly are
probably small cysts.

Small density dependently in the gallbladder on image 25/ 2 is
probably a small gallstone. No biliary dilatation.

Pancreas: 0.9 cm hypodense lesion posteriorly in the pancreatic body
on image [DATE], uncertain connectivity to the dorsal pancreatic duct.

Spleen: Unremarkable

Adrenals/Urinary Tract: Mild left hydronephrosis terminating at the
UPJ, but no delayed excretion or delayed nephrogram. Normal caliber
ureter.

Adrenal glands normal.

Stomach/Bowel: Sigmoid colon diverticulosis.

Vascular/Lymphatic: Aortoiliac atherosclerotic vascular disease.

Reproductive: Apparently associated with the left adnexa and with
face oral appearance along the adnexa, there is a 24.1 by 13.2 by
13.6 cm (volume = 2270 cm^3) multi cystic mass with thick
septations and possible soft tissue component inferiorly. This
extends up just below the transverse colon in the upper abdomen. The
lesion abuts and displaces bowel. There also appears to be a complex
cystic lesion in the cul-de-sac potentially with some associated
free fluid, this lesion measures 5.3 by 4.2 by 4.4 cm (volume = 51
cm^3) and could represent another deposit of tumor. This raises
the possibility of early peritoneal spread. Unfortunately, pelvic
findings are obscured by the streak artifact from the patient's
right hip prosthesis. The uterus is mildly indistinct and grossly
unremarkable.

Other: No supplemental non-categorized findings.

Musculoskeletal: Grade 1 degenerative anterolisthesis at L4-5, with
right foraminal impingement due to disc uncovering and facet
arthropathy.
IMPRESSION: 1. Very large multicystic mass extending cephalad from the left
adnexa, the mass measures up to 2270 cubic cm and extends up from
the pelvis essentially to the level of the transverse colon. There
is a high likelihood that this represents ovarian cancer. Correlate
with tumor marker levels such as BK-GY0. Gynecologic-oncologic
referral recommended.
2. In addition to the dominant tumor mass extending cephalad, there
appears to be a tumor mass in the cul-de-sac which could represent
some early peritoneal spread of tumor. Unfortunately the region is
somewhat obscured by streak artifact from the patient's right hip
implant.
3. On parasagittal images, there is a swirled appearance at the base
of the tumor, potentially reflecting torsion of the mass, which I
specu[REDACTED] be contributing to the patient's lower abdominal pain.
4. Hypodense liver lesions are probably benign but merits
surveillance.
5. Bochdalek's hernia containing adipose tissue.
6. Mild left hydronephrosis terminating at the left UPJ, but without
delayed excretion or delayed nephrogram, query chronic or congenital
UPJ narrowing.
7. Sigmoid colon diverticulosis.
8. Mild right foraminal impingement L4-5 related to anterolisthesis
and facet arthropathy.
Radiology assistant personnel have been notified to put me in
telephone contact with the referring physician or the referring
physician's clinical representative in order to discuss these
findings. Once this communication is established I will issue an
addendum to this report for documentation purposes.

## 2018-07-26 ENCOUNTER — Other Ambulatory Visit: Payer: Self-pay | Admitting: Obstetrics and Gynecology

## 2018-07-26 DIAGNOSIS — Z1231 Encounter for screening mammogram for malignant neoplasm of breast: Secondary | ICD-10-CM

## 2018-08-28 ENCOUNTER — Ambulatory Visit
Admission: RE | Admit: 2018-08-28 | Discharge: 2018-08-28 | Disposition: A | Discharge status: Home / Self Care | Payer: Medicare Other | Source: Ambulatory Visit | Attending: Obstetrics and Gynecology | Admitting: Obstetrics and Gynecology

## 2018-08-28 DIAGNOSIS — Z1231 Encounter for screening mammogram for malignant neoplasm of breast: Secondary | ICD-10-CM | POA: Insufficient documentation

## 2018-12-24 ENCOUNTER — Encounter: Payer: Self-pay | Admitting: Ophthalmology

## 2018-12-24 ENCOUNTER — Other Ambulatory Visit: Payer: Self-pay

## 2018-12-31 NOTE — Discharge Instructions (Signed)

## 2019-01-02 ENCOUNTER — Other Ambulatory Visit
Admission: RE | Admit: 2019-01-02 | Discharge: 2019-01-02 | Disposition: A | Discharge status: Home / Self Care | Payer: Medicare Other | Source: Ambulatory Visit | Attending: Ophthalmology | Admitting: Ophthalmology

## 2019-01-02 DIAGNOSIS — Z20828 Contact with and (suspected) exposure to other viral communicable diseases: Secondary | ICD-10-CM | POA: Diagnosis not present

## 2019-01-02 DIAGNOSIS — Z01812 Encounter for preprocedural laboratory examination: Secondary | ICD-10-CM | POA: Diagnosis present

## 2019-01-02 LAB — SARS CORONAVIRUS 2 (TAT 6-24 HRS): SARS Coronavirus 2: NEGATIVE

## 2019-01-06 ENCOUNTER — Encounter: Payer: Self-pay | Admitting: Ophthalmology

## 2019-01-06 ENCOUNTER — Other Ambulatory Visit: Payer: Self-pay

## 2019-01-06 ENCOUNTER — Ambulatory Visit
Admission: RE | Admit: 2019-01-06 | Discharge: 2019-01-06 | Disposition: A | Discharge status: Home / Self Care | Payer: Medicare Other | Attending: Ophthalmology | Admitting: Ophthalmology

## 2019-01-06 ENCOUNTER — Encounter
Admission: RE | Disposition: A | Discharge status: Home / Self Care | Payer: Self-pay | Source: Home / Self Care | Attending: Ophthalmology

## 2019-01-06 ENCOUNTER — Ambulatory Visit: Payer: Medicare Other | Admitting: Anesthesiology

## 2019-01-06 DIAGNOSIS — Z87891 Personal history of nicotine dependence: Secondary | ICD-10-CM | POA: Insufficient documentation

## 2019-01-06 DIAGNOSIS — G4730 Sleep apnea, unspecified: Secondary | ICD-10-CM | POA: Insufficient documentation

## 2019-01-06 DIAGNOSIS — F329 Major depressive disorder, single episode, unspecified: Secondary | ICD-10-CM | POA: Diagnosis not present

## 2019-01-06 DIAGNOSIS — F419 Anxiety disorder, unspecified: Secondary | ICD-10-CM | POA: Diagnosis not present

## 2019-01-06 DIAGNOSIS — H2511 Age-related nuclear cataract, right eye: Secondary | ICD-10-CM | POA: Insufficient documentation

## 2019-01-06 HISTORY — DX: Other specified postprocedural states: Z98.890

## 2019-01-06 HISTORY — DX: Nausea with vomiting, unspecified: R11.2

## 2019-01-06 HISTORY — PX: CATARACT EXTRACTION W/PHACO: SHX586

## 2019-01-06 HISTORY — DX: Presence of external hearing-aid: Z97.4

## 2019-01-06 SURGERY — PHACOEMULSIFICATION, CATARACT, WITH IOL INSERTION
Anesthesia: Monitor Anesthesia Care | Site: Eye | Laterality: Right

## 2019-01-06 MED ORDER — TETRACAINE HCL 0.5 % OP SOLN
1.0000 [drp] | OPHTHALMIC | Status: DC | PRN
  Administered 2019-01-06 (×3): 1 [drp] via OPHTHALMIC

## 2019-01-06 MED ORDER — EPINEPHRINE PF 1 MG/ML IJ SOLN
INTRAOCULAR | Status: DC | PRN
  Administered 2019-01-06: 82 mL via OPHTHALMIC

## 2019-01-06 MED ORDER — MOXIFLOXACIN HCL 0.5 % OP SOLN
OPHTHALMIC | Status: DC | PRN
  Administered 2019-01-06: 0.2 mL via OPHTHALMIC

## 2019-01-06 MED ORDER — FENTANYL CITRATE (PF) 100 MCG/2ML IJ SOLN
INTRAMUSCULAR | Status: DC | PRN
  Administered 2019-01-06: 50 ug via INTRAVENOUS

## 2019-01-06 MED ORDER — MIDAZOLAM HCL 2 MG/2ML IJ SOLN
INTRAMUSCULAR | Status: DC | PRN
  Administered 2019-01-06: 2 mg via INTRAVENOUS

## 2019-01-06 MED ORDER — ARMC OPHTHALMIC DILATING DROPS
1.0000 "application " | OPHTHALMIC | Status: DC | PRN
  Administered 2019-01-06 (×3): 1 via OPHTHALMIC

## 2019-01-06 MED ORDER — LIDOCAINE HCL (PF) 2 % IJ SOLN
INTRAOCULAR | Status: DC | PRN
  Administered 2019-01-06: 1 mL via INTRAOCULAR

## 2019-01-06 MED ORDER — SODIUM HYALURONATE 10 MG/ML IO SOLN
INTRAOCULAR | Status: DC | PRN
  Administered 2019-01-06: 0.55 mL via INTRAOCULAR

## 2019-01-06 MED ORDER — SODIUM HYALURONATE 23 MG/ML IO SOLN
INTRAOCULAR | Status: DC | PRN
  Administered 2019-01-06: 0.6 mL via INTRAOCULAR

## 2019-01-06 SURGICAL SUPPLY — 19 items
CANNULA ANT/CHMB 27G (MISCELLANEOUS) ×2 IMPLANT
CANNULA ANT/CHMB 27GA (MISCELLANEOUS) ×6 IMPLANT
DISSECTOR HYDRO NUCLEUS 50X22 (MISCELLANEOUS) ×3 IMPLANT
GLOVE SURG LX 7.5 STRW (GLOVE) ×2
GLOVE SURG LX STRL 7.5 STRW (GLOVE) ×1 IMPLANT
GLOVE SURG SYN 8.5  E (GLOVE) ×2
GLOVE SURG SYN 8.5 E (GLOVE) ×1 IMPLANT
GLOVE SURG SYN 8.5 PF PI (GLOVE) ×1 IMPLANT
GOWN STRL REUS W/ TWL LRG LVL3 (GOWN DISPOSABLE) ×2 IMPLANT
GOWN STRL REUS W/TWL LRG LVL3 (GOWN DISPOSABLE) ×4
LENS IOL TECNIS SYMFONY 15.0 ×2 IMPLANT
MARKER SKIN DUAL TIP RULER LAB (MISCELLANEOUS) ×3 IMPLANT
PACK DR. KING ARMS (PACKS) ×3 IMPLANT
PACK EYE AFTER SURG (MISCELLANEOUS) ×3 IMPLANT
PACK OPTHALMIC (MISCELLANEOUS) ×3 IMPLANT
SYR 3ML LL SCALE MARK (SYRINGE) ×3 IMPLANT
SYR TB 1ML LUER SLIP (SYRINGE) ×3 IMPLANT
WATER STERILE IRR 250ML POUR (IV SOLUTION) ×3 IMPLANT
WIPE NON LINTING 3.25X3.25 (MISCELLANEOUS) ×3 IMPLANT

## 2019-01-06 NOTE — Op Note (Signed)
OPERATIVE NOTE  Tammy Gould [id number] 01/06/2019   PREOPERATIVE DIAGNOSIS:  Nuclear sclerotic cataract right eye.  H25.11   POSTOPERATIVE DIAGNOSIS:    Nuclear sclerotic cataract right eye.     PROCEDURE:  Phacoemusification with posterior chamber intraocular lens placement of the right eye   LENS:   Implant Name Type Inv. Item Serial No. Manufacturer Lot No. LRB No. Used Action  LENS IOL SYMFONY 15.0 - [id number]  LENS IOL SYMFONY 15.0 [id number]  Right 1 Implanted       Procedure(s) with comments: CATARACT EXTRACTION PHACO AND INTRAOCULAR LENS PLACEMENT (IOC) RIGHT DIABETIC SYMFONY LENS  8.03  52.8 (Right) - sleep apnea  [id number] +15.0   ULTRASOUND TIME: 0 minutes 52 seconds.  CDE 8.03   SURGEON:  Benay Pillow, MD, MPH  ANESTHESIOLOGIST: Anesthesiologist: Page, Adele Barthel, MD CRNA: Mayme Genta, CRNA   ANESTHESIA:  Topical with tetracaine drops augmented with 1% preservative-free intracameral lidocaine.  ESTIMATED BLOOD LOSS: less than 1 mL.   COMPLICATIONS:  None.   DESCRIPTION OF PROCEDURE:  The patient was identified in the holding room and transported to the operating room and placed in the supine position under the operating microscope.  The right eye was identified as the operative eye and it was prepped and draped in the usual sterile ophthalmic fashion.   A 1.0 millimeter clear-corneal paracentesis was made at the 10:30 position. 0.5 ml of preservative-free 1% lidocaine with epinephrine was injected into the anterior chamber.  The anterior chamber was filled with Healon 5 viscoelastic.  A 2.4 millimeter keratome was used to make a near-clear corneal incision at the 8:00 position.  A curvilinear capsulorrhexis was made with a cystotome and capsulorrhexis forceps.  Balanced salt solution was used to hydrodissect and hydrodelineate the nucleus.   Phacoemulsification was then used in stop and chop fashion to remove the lens nucleus and epinucleus.  The remaining  cortex was then removed using the irrigation and aspiration handpiece. Healon was then placed into the capsular bag to distend it for lens placement.  A lens was then injected into the capsular bag.  The remaining viscoelastic was aspirated.   Wounds were hydrated with balanced salt solution.  The anterior chamber was inflated to a physiologic pressure with balanced salt solution.   Intracameral vigamox 0.1 mL undiluted was injected into the eye and a drop placed onto the ocular surface.  No wound leaks were noted.  The patient was taken to the recovery room in stable condition without complications of anesthesia or surgery  Benay Pillow 01/06/2019, 8:30 AM

## 2019-01-06 NOTE — Transfer of Care (Signed)
Immediate Anesthesia Transfer of Care Note  Patient: Tammy Gould  Procedure(s) Performed: CATARACT EXTRACTION PHACO AND INTRAOCULAR LENS PLACEMENT (IOC) RIGHT DIABETIC SYMFONY LENS  8.03  52.8 (Right Eye)  Patient Location: PACU  Anesthesia Type: MAC  Level of Consciousness: awake, alert  and patient cooperative  Airway and Oxygen Therapy: Patient Spontanous Breathing and Patient connected to supplemental oxygen  Post-op Assessment: Post-op Vital signs reviewed, Patient's Cardiovascular Status Stable, Respiratory Function Stable, Patent Airway and No signs of Nausea or vomiting  Post-op Vital Signs: Reviewed and stable  Complications: No apparent anesthesia complications

## 2019-01-06 NOTE — Anesthesia Procedure Notes (Signed)
Procedure Name: MAC Performed by: Sharilyn Geisinger, CRNA Pre-anesthesia Checklist: Patient identified, Emergency Drugs available, Suction available, Timeout performed and Patient being monitored Patient Re-evaluated:Patient Re-evaluated prior to induction Oxygen Delivery Method: Nasal cannula Placement Confirmation: positive ETCO2       

## 2019-01-06 NOTE — Anesthesia Postprocedure Evaluation (Signed)
Anesthesia Post Note  Patient: Tammy Gould  Procedure(s) Performed: CATARACT EXTRACTION PHACO AND INTRAOCULAR LENS PLACEMENT (IOC) RIGHT DIABETIC SYMFONY LENS  8.03  52.8 (Right Eye)     Patient location during evaluation: PACU Anesthesia Type: MAC Level of consciousness: awake and alert Pain management: pain level controlled Vital Signs Assessment: post-procedure vital signs reviewed and stable Respiratory status: spontaneous breathing, nonlabored ventilation, respiratory function stable and patient connected to nasal cannula oxygen Cardiovascular status: blood pressure returned to baseline and stable Postop Assessment: no apparent nausea or vomiting Anesthetic complications: no    Adele Barthel Kenniya Westrich

## 2019-01-06 NOTE — H&P (Signed)

## 2019-01-06 NOTE — Anesthesia Preprocedure Evaluation (Signed)
Anesthesia Evaluation  Patient identified by MRN, date of birth, ID band Patient awake    History of Anesthesia Complications (+) PONV and history of anesthetic complications  Airway Mallampati: II  TM Distance: >3 FB Neck ROM: Full    Dental no notable dental hx.    Pulmonary sleep apnea and Continuous Positive Airway Pressure Ventilation , former smoker,    Pulmonary exam normal        Cardiovascular Exercise Tolerance: Good negative cardio ROS Normal cardiovascular exam     Neuro/Psych negative neurological ROS  negative psych ROS   GI/Hepatic negative GI ROS, Neg liver ROS,   Endo/Other  negative endocrine ROS  Renal/GU negative Renal ROS     Musculoskeletal negative musculoskeletal ROS (+)   Abdominal   Peds  Hematology negative hematology ROS (+)   Anesthesia Other Findings   Reproductive/Obstetrics                            Anesthesia Physical Anesthesia Plan  ASA: II  Anesthesia Plan: MAC   Post-op Pain Management:    Induction: Intravenous  PONV Risk Score and Plan: 3 and Midazolam and Treatment may vary due to age or medical condition  Airway Management Planned: Nasal Cannula  Additional Equipment: None  Intra-op Plan:   Post-operative Plan:   Informed Consent: I have reviewed the patients History and Physical, chart, labs and discussed the procedure including the risks, benefits and alternatives for the proposed anesthesia with the patient or authorized representative who has indicated his/her understanding and acceptance.       Plan Discussed with: CRNA  Anesthesia Plan Comments:         Anesthesia Quick Evaluation

## 2019-01-07 ENCOUNTER — Encounter: Payer: Self-pay | Admitting: *Deleted

## 2019-01-16 ENCOUNTER — Other Ambulatory Visit: Payer: Self-pay

## 2019-01-16 ENCOUNTER — Encounter: Payer: Self-pay | Admitting: Ophthalmology

## 2019-01-23 ENCOUNTER — Other Ambulatory Visit
Admission: RE | Admit: 2019-01-23 | Discharge: 2019-01-23 | Disposition: A | Discharge status: Home / Self Care | Payer: Medicare Other | Source: Ambulatory Visit | Attending: Ophthalmology | Admitting: Ophthalmology

## 2019-01-23 DIAGNOSIS — Z01812 Encounter for preprocedural laboratory examination: Secondary | ICD-10-CM | POA: Insufficient documentation

## 2019-01-23 DIAGNOSIS — Z20822 Contact with and (suspected) exposure to covid-19: Secondary | ICD-10-CM | POA: Insufficient documentation

## 2019-01-23 LAB — SARS CORONAVIRUS 2 (TAT 6-24 HRS): SARS Coronavirus 2: NEGATIVE

## 2019-01-23 NOTE — Discharge Instructions (Signed)

## 2019-01-27 ENCOUNTER — Encounter
Admission: RE | Disposition: A | Discharge status: Home / Self Care | Payer: Self-pay | Source: Home / Self Care | Attending: Ophthalmology

## 2019-01-27 ENCOUNTER — Ambulatory Visit
Admission: RE | Admit: 2019-01-27 | Discharge: 2019-01-27 | Disposition: A | Discharge status: Home / Self Care | Payer: Medicare Other | Attending: Ophthalmology | Admitting: Ophthalmology

## 2019-01-27 ENCOUNTER — Ambulatory Visit: Payer: Medicare Other | Admitting: Anesthesiology

## 2019-01-27 ENCOUNTER — Encounter: Payer: Self-pay | Admitting: Ophthalmology

## 2019-01-27 ENCOUNTER — Other Ambulatory Visit: Payer: Self-pay

## 2019-01-27 DIAGNOSIS — Z87891 Personal history of nicotine dependence: Secondary | ICD-10-CM | POA: Diagnosis not present

## 2019-01-27 DIAGNOSIS — F329 Major depressive disorder, single episode, unspecified: Secondary | ICD-10-CM | POA: Insufficient documentation

## 2019-01-27 DIAGNOSIS — G4730 Sleep apnea, unspecified: Secondary | ICD-10-CM | POA: Diagnosis not present

## 2019-01-27 DIAGNOSIS — H2512 Age-related nuclear cataract, left eye: Secondary | ICD-10-CM | POA: Insufficient documentation

## 2019-01-27 DIAGNOSIS — F419 Anxiety disorder, unspecified: Secondary | ICD-10-CM | POA: Diagnosis not present

## 2019-01-27 DIAGNOSIS — Z6831 Body mass index (BMI) 31.0-31.9, adult: Secondary | ICD-10-CM | POA: Diagnosis not present

## 2019-01-27 DIAGNOSIS — Z96649 Presence of unspecified artificial hip joint: Secondary | ICD-10-CM | POA: Insufficient documentation

## 2019-01-27 HISTORY — PX: CATARACT EXTRACTION W/PHACO: SHX586

## 2019-01-27 SURGERY — PHACOEMULSIFICATION, CATARACT, WITH IOL INSERTION
Anesthesia: Monitor Anesthesia Care | Site: Eye | Laterality: Left

## 2019-01-27 MED ORDER — TETRACAINE HCL 0.5 % OP SOLN
1.0000 [drp] | OPHTHALMIC | Status: DC | PRN
  Administered 2019-01-27 (×3): 1 [drp] via OPHTHALMIC

## 2019-01-27 MED ORDER — MIDAZOLAM HCL 2 MG/2ML IJ SOLN
INTRAMUSCULAR | Status: DC | PRN
  Administered 2019-01-27: 2 mg via INTRAVENOUS

## 2019-01-27 MED ORDER — SODIUM HYALURONATE 10 MG/ML IO SOLN
INTRAOCULAR | Status: DC | PRN
  Administered 2019-01-27: 0.55 mL via INTRAOCULAR

## 2019-01-27 MED ORDER — FENTANYL CITRATE (PF) 100 MCG/2ML IJ SOLN
INTRAMUSCULAR | Status: DC | PRN
  Administered 2019-01-27: 50 ug via INTRAVENOUS

## 2019-01-27 MED ORDER — MOXIFLOXACIN HCL 0.5 % OP SOLN
OPHTHALMIC | Status: DC | PRN
  Administered 2019-01-27: 0.2 mL via OPHTHALMIC

## 2019-01-27 MED ORDER — LIDOCAINE HCL (PF) 2 % IJ SOLN
INTRAOCULAR | Status: DC | PRN
  Administered 2019-01-27: 1 mL via INTRAOCULAR

## 2019-01-27 MED ORDER — ARMC OPHTHALMIC DILATING DROPS
1.0000 "application " | OPHTHALMIC | Status: DC | PRN
  Administered 2019-01-27 (×3): 1 via OPHTHALMIC

## 2019-01-27 MED ORDER — EPINEPHRINE PF 1 MG/ML IJ SOLN
INTRAOCULAR | Status: DC | PRN
  Administered 2019-01-27: 97 mL via OPHTHALMIC

## 2019-01-27 MED ORDER — SODIUM HYALURONATE 23 MG/ML IO SOLN
INTRAOCULAR | Status: DC | PRN
  Administered 2019-01-27: 0.6 mL via INTRAOCULAR

## 2019-01-27 SURGICAL SUPPLY — 19 items
CANNULA ANT/CHMB 27G (MISCELLANEOUS) ×2 IMPLANT
CANNULA ANT/CHMB 27GA (MISCELLANEOUS) ×4 IMPLANT
DISSECTOR HYDRO NUCLEUS 50X22 (MISCELLANEOUS) ×2 IMPLANT
GLOVE SURG LX 7.5 STRW (GLOVE) ×1
GLOVE SURG LX STRL 7.5 STRW (GLOVE) ×1 IMPLANT
GLOVE SURG SYN 8.5  E (GLOVE) ×1
GLOVE SURG SYN 8.5 E (GLOVE) ×1 IMPLANT
GLOVE SURG SYN 8.5 PF PI (GLOVE) ×1 IMPLANT
GOWN STRL REUS W/ TWL LRG LVL3 (GOWN DISPOSABLE) ×2 IMPLANT
GOWN STRL REUS W/TWL LRG LVL3 (GOWN DISPOSABLE) ×2
LENS IOL TECNIS SYMFONY 15.0 ×1 IMPLANT
MARKER SKIN DUAL TIP RULER LAB (MISCELLANEOUS) ×2 IMPLANT
PACK DR. KING ARMS (PACKS) ×2 IMPLANT
PACK EYE AFTER SURG (MISCELLANEOUS) ×2 IMPLANT
PACK OPTHALMIC (MISCELLANEOUS) ×2 IMPLANT
SYR 3ML LL SCALE MARK (SYRINGE) ×2 IMPLANT
SYR TB 1ML LUER SLIP (SYRINGE) ×2 IMPLANT
WATER STERILE IRR 250ML POUR (IV SOLUTION) ×2 IMPLANT
WIPE NON LINTING 3.25X3.25 (MISCELLANEOUS) ×2 IMPLANT

## 2019-01-27 NOTE — Anesthesia Postprocedure Evaluation (Signed)
Anesthesia Post Note  Patient: Tammy Gould  Procedure(s) Performed: CATARACT EXTRACTION PHACO AND INTRAOCULAR LENS PLACEMENT (IOC) LEFT DIABETIC SYMFONY LENS 3.39  00:35.9 (Left Eye)     Patient location during evaluation: PACU Anesthesia Type: MAC Level of consciousness: awake and alert Pain management: pain level controlled Vital Signs Assessment: post-procedure vital signs reviewed and stable Respiratory status: spontaneous breathing Cardiovascular status: blood pressure returned to baseline Postop Assessment: no apparent nausea or vomiting, adequate PO intake and no headache Anesthetic complications: no    Adele Barthel Dyana Magner

## 2019-01-27 NOTE — H&P (Signed)

## 2019-01-27 NOTE — Anesthesia Preprocedure Evaluation (Signed)
Anesthesia Evaluation  Patient identified by MRN, date of birth, ID band Patient awake    History of Anesthesia Complications (+) PONV and history of anesthetic complications  Airway Mallampati: II  TM Distance: >3 FB Neck ROM: Full    Dental no notable dental hx.    Pulmonary sleep apnea and Continuous Positive Airway Pressure Ventilation , former smoker,    Pulmonary exam normal        Cardiovascular Exercise Tolerance: Good negative cardio ROS Normal cardiovascular exam     Neuro/Psych negative neurological ROS  negative psych ROS   GI/Hepatic negative GI ROS, Neg liver ROS,   Endo/Other  Morbid obesity (BMI 32)  Renal/GU negative Renal ROS     Musculoskeletal negative musculoskeletal ROS (+)   Abdominal   Peds  Hematology negative hematology ROS (+)   Anesthesia Other Findings   Reproductive/Obstetrics                            Anesthesia Physical  Anesthesia Plan  ASA: II  Anesthesia Plan: MAC   Post-op Pain Management:    Induction: Intravenous  PONV Risk Score and Plan: 3 and Midazolam and Treatment may vary due to age or medical condition  Airway Management Planned: Nasal Cannula  Additional Equipment: None  Intra-op Plan:   Post-operative Plan:   Informed Consent: I have reviewed the patients History and Physical, chart, labs and discussed the procedure including the risks, benefits and alternatives for the proposed anesthesia with the patient or authorized representative who has indicated his/her understanding and acceptance.       Plan Discussed with: CRNA  Anesthesia Plan Comments:         Anesthesia Quick Evaluation

## 2019-01-27 NOTE — Transfer of Care (Signed)
Immediate Anesthesia Transfer of Care Note  Patient: Tammy Gould  Procedure(s) Performed: CATARACT EXTRACTION PHACO AND INTRAOCULAR LENS PLACEMENT (IOC) LEFT DIABETIC SYMFONY LENS (Left Eye)  Patient Location: PACU  Anesthesia Type: MAC  Level of Consciousness: awake, alert  and patient cooperative  Airway and Oxygen Therapy: Patient Spontanous Breathing and Patient connected to supplemental oxygen  Post-op Assessment: Post-op Vital signs reviewed, Patient's Cardiovascular Status Stable, Respiratory Function Stable, Patent Airway and No signs of Nausea or vomiting  Post-op Vital Signs: Reviewed and stable  Complications: No apparent anesthesia complications

## 2019-01-27 NOTE — Op Note (Signed)
OPERATIVE NOTE  TASHAWNDA GMEREK [id number] 01/27/2019   PREOPERATIVE DIAGNOSIS:  Nuclear sclerotic cataract left eye.  H25.12   POSTOPERATIVE DIAGNOSIS:    Nuclear sclerotic cataract left eye.     PROCEDURE:  Phacoemusification with posterior chamber intraocular lens placement of the left eye   LENS:   Implant Name Type Inv. Item Serial No. Manufacturer Lot No. LRB No. Used Action  LENS IOL SYMFONY 15.0 - [id number]  LENS IOL SYMFONY 15.0 [id number] AMO  Left 1 Implanted      Procedure(s): CATARACT EXTRACTION PHACO AND INTRAOCULAR LENS PLACEMENT (IOC) LEFT DIABETIC SYMFONY LENS 3.39  00:35.9 (Left)  [id number] +15.0   ULTRASOUND TIME: 0 minutes 35 seconds.  CDE 3.39   SURGEON:  Benay Pillow, MD, MPH   ANESTHESIA:  Topical with tetracaine drops augmented with 1% preservative-free intracameral lidocaine.  ESTIMATED BLOOD LOSS: <1 mL   COMPLICATIONS:  None.   DESCRIPTION OF PROCEDURE:  The patient was identified in the holding room and transported to the operating room and placed in the supine position under the operating microscope.  The left eye was identified as the operative eye and it was prepped and draped in the usual sterile ophthalmic fashion.   A 1.0 millimeter clear-corneal paracentesis was made at the 5:00 position. 0.5 ml of preservative-free 1% lidocaine with epinephrine was injected into the anterior chamber.  The anterior chamber was filled with Healon 5 viscoelastic.  A 2.4 millimeter keratome was used to make a near-clear corneal incision at the 2:00 position.  A curvilinear capsulorrhexis was made with a cystotome and capsulorrhexis forceps.  Balanced salt solution was used to hydrodissect and hydrodelineate the nucleus.   Phacoemulsification was then used in stop and chop fashion to remove the lens nucleus and epinucleus.  The remaining cortex was then removed using the irrigation and aspiration handpiece. Healon was then placed into the capsular bag to distend it for  lens placement.  A lens was then injected into the capsular bag.  The remaining viscoelastic was aspirated.   Wounds were hydrated with balanced salt solution.  The anterior chamber was inflated to a physiologic pressure with balanced salt solution.  Intracameral vigamox 0.1 mL undiltued was injected into the eye and a drop placed onto the ocular surface.  No wound leaks were noted.  The patient was taken to the recovery room in stable condition without complications of anesthesia or surgery  Benay Pillow 01/27/2019, 8:34 AM

## 2019-01-28 ENCOUNTER — Encounter: Payer: Self-pay | Admitting: *Deleted

## 2019-02-22 ENCOUNTER — Ambulatory Visit: Payer: Medicare Other | Attending: Internal Medicine

## 2019-02-22 DIAGNOSIS — Z23 Encounter for immunization: Secondary | ICD-10-CM | POA: Insufficient documentation

## 2019-02-22 NOTE — Progress Notes (Signed)
   Covid-19 Vaccination Clinic  Name:  Tammy Gould    MRN: [id number] DOB: 06-29-1950  02/22/2019  [name] was observed post Covid-19 immunization for 15 minutes without incidence. She was provided with Vaccine Information Sheet and instruction to access the V-Safe system.   [name] was instructed to call 911 with any severe reactions post vaccine: Marland Kitchen Difficulty breathing  . Swelling of your face and throat  . A fast heartbeat  . A bad rash all over your body  . Dizziness and weakness    Immunizations Administered    Name Date Dose VIS Date Route   Pfizer COVID-19 Vaccine 02/22/2019  1:11 PM 0.3 mL 12/13/2018 Intramuscular   Manufacturer: [place]   Lot: [id number]   [place]: [id number]

## 2019-03-19 ENCOUNTER — Ambulatory Visit: Payer: Medicare Other | Attending: Internal Medicine

## 2019-03-19 DIAGNOSIS — Z23 Encounter for immunization: Secondary | ICD-10-CM

## 2019-03-19 NOTE — Progress Notes (Signed)
   Covid-19 Vaccination Clinic  Name:  Tammy Gould    MRN: [id number] DOB: August 21, 1950  03/19/2019  Tammy Gould was observed post Covid-19 immunization for 15 minutes without incident. She was provided with Vaccine Information Sheet and instruction to access the V-Safe system.   Tammy Gould was instructed to call 911 with any severe reactions post vaccine: Marland Kitchen Difficulty breathing  . Swelling of face and throat  . A fast heartbeat  . A bad rash all over body  . Dizziness and weakness   Immunizations Administered    Name Date Dose VIS Date Route   Pfizer COVID-19 Vaccine 03/19/2019  8:31 AM 0.3 mL 12/13/2018 Intramuscular   Manufacturer: [place]   Lot: [id number]   [place]: [id number]

## 2019-08-12 ENCOUNTER — Other Ambulatory Visit: Payer: Self-pay | Admitting: Obstetrics and Gynecology

## 2019-08-21 ENCOUNTER — Other Ambulatory Visit: Payer: Self-pay | Admitting: Internal Medicine

## 2019-09-03 ENCOUNTER — Other Ambulatory Visit: Payer: Self-pay | Admitting: Obstetrics & Gynecology

## 2019-09-03 DIAGNOSIS — N6001 Solitary cyst of right breast: Secondary | ICD-10-CM

## 2019-09-03 DIAGNOSIS — N6310 Unspecified lump in the right breast, unspecified quadrant: Secondary | ICD-10-CM

## 2019-09-12 ENCOUNTER — Ambulatory Visit
Admission: RE | Admit: 2019-09-12 | Discharge: 2019-09-12 | Disposition: A | Discharge status: Home / Self Care | Payer: Medicare Other | Source: Ambulatory Visit | Attending: Obstetrics & Gynecology | Admitting: Obstetrics & Gynecology

## 2019-09-12 DIAGNOSIS — N6310 Unspecified lump in the right breast, unspecified quadrant: Secondary | ICD-10-CM

## 2019-09-12 DIAGNOSIS — N6001 Solitary cyst of right breast: Secondary | ICD-10-CM

## 2019-10-12 ENCOUNTER — Other Ambulatory Visit: Payer: Self-pay

## 2019-10-12 ENCOUNTER — Emergency Department
Admission: EM | Admit: 2019-10-12 | Discharge: 2019-10-12 | Disposition: A | Discharge status: Home / Self Care | Payer: Medicare Other | Attending: Emergency Medicine | Admitting: Emergency Medicine

## 2019-10-12 ENCOUNTER — Emergency Department: Payer: Medicare Other

## 2019-10-12 DIAGNOSIS — Z87891 Personal history of nicotine dependence: Secondary | ICD-10-CM | POA: Insufficient documentation

## 2019-10-12 DIAGNOSIS — Y92009 Unspecified place in unspecified non-institutional (private) residence as the place of occurrence of the external cause: Secondary | ICD-10-CM | POA: Insufficient documentation

## 2019-10-12 DIAGNOSIS — Z96641 Presence of right artificial hip joint: Secondary | ICD-10-CM | POA: Diagnosis not present

## 2019-10-12 DIAGNOSIS — M25551 Pain in right hip: Secondary | ICD-10-CM | POA: Insufficient documentation

## 2019-10-12 DIAGNOSIS — W108XXA Fall (on) (from) other stairs and steps, initial encounter: Secondary | ICD-10-CM | POA: Diagnosis not present

## 2019-10-12 DIAGNOSIS — W19XXXA Unspecified fall, initial encounter: Secondary | ICD-10-CM

## 2019-10-12 MED ORDER — KETOROLAC TROMETHAMINE 30 MG/ML IJ SOLN
15.0000 mg | Freq: Once | INTRAMUSCULAR | Status: AC
  Administered 2019-10-12: 15 mg via INTRAVENOUS
  Filled 2019-10-12: qty 1

## 2019-10-12 MED ORDER — HYDROCODONE-ACETAMINOPHEN 5-325 MG PO TABS: 1.0000 | ORAL_TABLET | Freq: Three times a day (TID) | ORAL | 0 refills | Status: AC | PRN

## 2019-10-12 MED ORDER — NAPROXEN 500 MG PO TABS: 500.0000 mg | ORAL_TABLET | Freq: Two times a day (BID) | ORAL | 0 refills | Status: AC

## 2019-10-12 NOTE — ED Notes (Signed)
Pt demonstrates correct use of walker.

## 2019-10-12 NOTE — ED Triage Notes (Signed)
First Rn Note: pt presents to ED via [facility] with c/o R hip pain, per EMS pt tripped, fell down 2 steps and landed on R artificial hip.   18G to L AC 52mcg Fentanyl 4mg  Zofran

## 2019-10-12 NOTE — Discharge Instructions (Signed)
Your exam and XR are normal at this time. You do not have any evidence of fracture, dislocation, or disruption of the hardware. Take the prescription meds as directed. Follow-up with your provider as needed. Apply ice to reduce pain.

## 2019-10-12 NOTE — ED Triage Notes (Addendum)
Please read first nurse note. Pt reports right hip pain after falling down 2 steps. Rt hip replaced in 2016. Pt states pain similar to when she fell previously in July. Report pain in groin and hip. Pt able to move leg, but does cause discomfort. No outward rotation or acute distress noted , pulse present and WNL.

## 2019-10-12 NOTE — ED Provider Notes (Signed)
[facility] Emergency Department Provider Note ____________________________________________  Time seen: 1554  I have reviewed the triage vital signs and the nursing notes.  HISTORY  Chief Complaint  Hip Pain  HPI Tammy Gould is a 69 y.o. female presents herself to the ED via EMS, from home.  Patient reports a mechanical fall, landing on her right hip after falling down 2 steps.  Patient is status post a right hip total arthroplasty from 2016.   She denies any other injury at this time.  She was given pain meds in route to the ED via EMS including fentanyl and Zofran.  Past Medical History:  Diagnosis Date  . Anxiety   . Arthritis    hands  . Cancer ([place]) 2019   skin  . Hepatitis B 1972   hep b antigen negative 07/20/2016  . PONV (postoperative nausea and vomiting)   . Pre-diabetes   . Sleep apnea    uses cpap  . Vertigo 01/2017   off and on for about 6 months.  Relieved by [facility].  . Wears hearing aid in both ears    Has, does not wear.    Patient Active Problem List   Diagnosis Date Noted  . Ovarian mass, left 07/19/2016    Past Surgical History:  Procedure Laterality Date  . ABDOMINAL HYSTERECTOMY Bilateral 07/21/2016   Procedure: HYSTERECTOMY ABDOMINAL;  Surgeon: Ward, Honor Loh, MD;  Location: [facility] ORS;  Service: Gynecology;  Laterality: Bilateral;  . ABDOMINAL HYSTERECTOMY    . APPENDECTOMY    . BREAST EXCISIONAL BIOPSY Bilateral [id number]   Multiple biopsies (4), negative  . CATARACT EXTRACTION W/PHACO Right 01/06/2019   Procedure: CATARACT EXTRACTION PHACO AND INTRAOCULAR LENS PLACEMENT (IOC) RIGHT DIABETIC SYMFONY LENS  8.03  52.8;  Surgeon: Eulogio Bear, MD;  Location: [place];  Service: Ophthalmology;  Laterality: Right;  sleep apnea  . CATARACT EXTRACTION W/PHACO Left 01/27/2019   Procedure: CATARACT EXTRACTION PHACO AND INTRAOCULAR LENS PLACEMENT (IOC) LEFT DIABETIC SYMFONY LENS 3.39  00:35.9;  Surgeon:  Eulogio Bear, MD;  Location: [place];  Service: Ophthalmology;  Laterality: Left;  . SALPINGOOPHORECTOMY Bilateral 07/21/2016   Procedure: SALPINGO OOPHORECTOMY;  Surgeon: Ward, Honor Loh, MD;  Location: [facility] ORS;  Service: Gynecology;  Laterality: Bilateral;  . TONSILLECTOMY    . TOTAL HIP ARTHROPLASTY Right 2015  . VENTRAL HERNIA REPAIR N/A 12/27/2017   Procedure: LAPAROSCOPIC VENTRAL HERNIA REPAIR WITH MESH;  Surgeon: Benjamine Sprague, DO;  Location: [facility] ORS;  Service: General;  Laterality: N/A;  . WRIST SURGERY Right 2012   plate and screw in wrist    Prior to Admission medications   Medication Sig Start Date End Date Taking? Authorizing Provider  acetaminophen (TYLENOL) 650 MG CR tablet Take 650 mg by mouth every 8 (eight) hours as needed for pain.    [provider]  ALPRAZolam Duanne Moron) 0.25 MG tablet Take 0.25 mg by mouth daily as needed for anxiety.    [provider]  cholecalciferol (VITAMIN D3) 25 MCG (1000 UT) tablet Take 1,000 Units by mouth daily.    [provider]  DULoxetine (CYMBALTA) 20 MG capsule Take 20 mg by mouth daily.    [provider]  polyethylene glycol powder (GLYCOLAX/MIRALAX) powder Take 255 g by mouth daily. Take one cap-full each morning 07/23/16   Ward, Honor Loh, MD  vitamin B-12 (CYANOCOBALAMIN) 1000 MCG tablet Take 1,000 mcg by mouth daily.    [provider]  Allergies Penicillins  Family History  Problem Relation Age of Onset  . Breast cancer Paternal Grandmother 40    Social History Social History   Tobacco Use  . Smoking status: Former Smoker    Types: Cigarettes    Quit date: 01/02/1986    Years since quitting: 33.7  . Smokeless tobacco: Never Used  Vaping Use  . Vaping Use: Never used  Substance Use Topics  . Alcohol use: Yes    Comment: occasionally  . Drug use: No    Review of Systems  Constitutional: Negative for fever. Eyes: Negative for visual changes. ENT:  Negative for sore throat. Cardiovascular: Negative for chest pain. Respiratory: Negative for shortness of breath. Gastrointestinal: Negative for abdominal pain, vomiting and diarrhea. Genitourinary: Negative for dysuria. Musculoskeletal: Negative for back pain.  Right hip pain as above. Skin: Negative for rash. Neurological: Negative for headaches, focal weakness or numbness. ____________________________________________  PHYSICAL EXAM:  VITAL SIGNS: ED Triage Vitals  Enc Vitals Group     BP 10/12/19 1345 (!) 149/78     Pulse Rate 10/12/19 1345 62     Resp 10/12/19 1345 18     Temp 10/12/19 1345 97.8 F (36.6 C)     Temp src --      SpO2 10/12/19 1345 97 %     Weight 10/12/19 1347 178 lb (80.7 kg)     Height 10/12/19 1347 5' 3.5" (1.613 m)     Head Circumference --      Peak Flow --      Pain Score 10/12/19 1347 3     Pain Loc --      Pain Edu? --      Excl. in [place]? --     Constitutional: Alert and oriented. Well appearing and in no distress. Head: Normocephalic and atraumatic. Eyes: Conjunctivae are normal. Normal extraocular movements Neck: Supple. No thyromegaly. Cardiovascular: Normal rate, regular rhythm. Normal distal pulses. Respiratory: Normal respiratory effort. No wheezes/rales/rhonchi. Gastrointestinal: Soft and nontender. No distention. Musculoskeletal: Normal spinal alignment without midline tenderness, spasm, deformity, or step-off.  Patient with mild tenderness to palpation to the lateral aspect of the right hip.  No obvious deformity, leg length discrepancy, or 6 external rotation.  Knee and ankle exam is normal and benign at this time.  Nontender with normal range of motion in all extremities.  Neurologic:  Normal speech and language. No gross focal neurologic deficits are appreciated. Skin:  Skin is warm, dry and intact. No rash noted. Psychiatric: Mood and affect are normal. Patient exhibits appropriate insight and  judgment. ____________________________________________   RADIOLOGY  DG Right Hip w/ Pelvis  IMPRESSION: 1. No acute osseous abnormality. 2. Status post RIGHT hip arthroplasty without evidence of hardware complication.  ____________________________________________  PROCEDURES  Toradol 15 mg IVP  Procedures ____________________________________________  INITIAL IMPRESSION / ASSESSMENT AND PLAN / ED COURSE  DDX: hip fracture, hip dislocation, hip sprain  Patient with ED evaluation following mechanical fall at home.  Exam is overall benign reassuring at this time.  No x-ray evidence of acute fracture or dislocation to the hip or pelvis.  No hardware disruption related to the right hip arthroplasty.  Patient's exam is benign and she is reporting improvement of her pain after medication administration.  She is reassured by the findings, and is cleared at this time to discharge home.  She will follow-up with her primary provider for ongoing symptoms or return to the ED if necessary.  Tammy Gould was evaluated in Emergency Department  on 10/12/2019 for the symptoms described in the history of present illness. She was evaluated in the context of the global COVID-19 pandemic, which necessitated consideration that the patient might be at risk for infection with the SARS-CoV-2 virus that causes COVID-19. Institutional protocols and algorithms that pertain to the evaluation of patients at risk for COVID-19 are in a state of rapid change based on information released by regulatory bodies including the CDC and federal and state organizations. These policies and algorithms were followed during the patient's care in the ED.  I reviewed the patient's prescription history over the last 12 months in the multi-state controlled substances database(s) that includes [place], [place], [place], [place], [place], [place], [place], [place], [place], [place], [place], [place],  [place], and [place].  Results were notable for no current RX.  ____________________________________________  FINAL CLINICAL IMPRESSION(S) / ED DIAGNOSES  Final diagnoses:  Right hip pain  Fall at home, initial encounter      Melvenia Needles, PA-C 10/16/19 1042    Nance Pear, MD 10/16/19 [phone]

## 2019-12-25 ENCOUNTER — Non-Acute Institutional Stay: Payer: Self-pay | Admitting: Nurse Practitioner

## 2019-12-25 NOTE — Progress Notes (Deleted)
  [facility] Consult Note Telephone: [phone]  Fax: [phone]  PATIENT NAME: Tammy Gould [address] [place] [address] [phone] (home)  DOB: 08/10/1950 MRN: [id number]  PRIMARY CARE PROVIDER:    Miller, Mark F, MD,  [address] [facility] [place] [address] [phone]  REFERRING PROVIDER:   Miller, Mark F, MD [address] [facility] [place],  [place] [address] [phone]  RESPONSIBLE PARTY:   Extended Emergency Contact Information Primary Emergency Contact: Downs,Laurie  [place] Mobile Phone: [phone] Relation: Daughter  I met face to face with patient and family in home/facility. Patient present but unable to substantively engage in process.   ASSESSMENT AND RECOMMENDATIONS:   Advance Care Planning: Visit at the request of *** for palliative consult. Visit consisted of building trust and discussions on Palliative Medicine as specialized medical care for people living with serious illness, aimed at facilitating better quality of life through symptoms relief, assisting with advance care plan and establishing goals of care. Family expressed appreciation for education provided on Palliative care and how that differs from Hospice service. Palliative care will continue to provide support to patient, family and the medical team. Goal of care: Directives:  Symptom Management:   Follow up Palliative Care Visit: Palliative care will continue to follow for goals of care clarification and symptom management. Return *** weeks or prn.  Family /Caregiver/Community Supports:   Cognitive / Functional decline:   I spent *** minutes providing this consultation, time includes time spent with patient/family, chart review, provider coordination, and documentation. More than 50% of the time in this consultation was spent counseling and  coordinating communication.   CHIEF COMPLAINT:  History obtained from review of EMR, discussion with primary team, and  interview with family, caregiver  and/or ****. Records reviewed and summarized bellow.  HISTORY OF PRESENT ILLNESS:  Tammy Gould is a 69 y.o. year old female with multiple medical problems including ***. Palliative Care was asked to follow this patient by consultation request of Miller, Mark F, MD to help address advance care planning and goals of care. This is a *** visit.  CODE STATUS:   PPS: ***0%  HOSPICE ELIGIBILITY/DIAGNOSIS: TBD  PHYSICAL EXAM / ROS:   Current and past weights: *** General: NAD, frail appearing, thin Cardiovascular: no chest pain reported, no edema  Pulmonary: no cough, no increased SOB, room air GI: Swallowing ***, appetite fair, endorses constipation, incontinent of bowel GU: denies dysuria, incontinent of urine MSK:  no joint and ROM abnormalities, ambulatory Skin: no rashes or wounds reported Neurological: Weakness, but otherwise nonfocal Psych: non -anxious affect  PAST MEDICAL HISTORY:  Past Medical History:  Diagnosis Date  . Anxiety   . Arthritis    hands  . Cancer (HCC) 2019   skin  . Hepatitis B 1972   hep b antigen negative 07/20/2016  . PONV (postoperative nausea and vomiting)   . Pre-diabetes   . Sleep apnea    uses cpap  . Vertigo 01/2017   off and on for about 6 months.  Relieved by [facility].  . Wears hearing aid in both ears    Has, does not wear.    SOCIAL HX:  Social History   Tobacco Use  . Smoking status: Former Smoker    Types: Cigarettes    Quit date: 01/02/1986    Years since quitting: 34.0  . Smokeless tobacco: Never Used    Substance Use Topics  . Alcohol use: Yes    Comment: occasionally   FAMILY HX:  Family History  Problem Relation Age of Onset  . Breast cancer Paternal Grandmother 11    ALLERGIES:  Allergies  Allergen Reactions  . Penicillins Hives    Has patient had a PCN  reaction causing immediate rash, facial/tongue/throat swelling, SOB or lightheadedness with hypotension: Yes Has patient had a PCN reaction causing severe rash involving mucus membranes or skin necrosis: No Has patient had a PCN reaction that required hospitalization: No Has patient had a PCN reaction occurring within the last 10 years: No If all of the above answers are "NO", then may proceed with Cephalosporin use.     PERTINENT MEDICATIONS:  Outpatient Encounter Medications as of 12/25/2019  Medication Sig  . acetaminophen (TYLENOL) 650 MG CR tablet Take 650 mg by mouth every 8 (eight) hours as needed for pain.  Marland Kitchen ALPRAZolam (XANAX) 0.25 MG tablet Take 0.25 mg by mouth daily as needed for anxiety.  . cholecalciferol (VITAMIN D3) 25 MCG (1000 UT) tablet Take 1,000 Units by mouth daily.  . DULoxetine (CYMBALTA) 20 MG capsule Take 20 mg by mouth daily.  . polyethylene glycol powder (GLYCOLAX/MIRALAX) powder Take 255 g by mouth daily. Take one cap-full each morning  . vitamin B-12 (CYANOCOBALAMIN) 1000 MCG tablet Take 1,000 mcg by mouth daily.   No facility-administered encounter medications on file as of 12/25/2019.     Thank you for the opportunity to participate in the care of@. The palliative care team will continue to follow. Please call our office at [phone] if we can be of additional assistance.  Alba Destine, NP , DNP, AGPCNP-BC  COVID-19 PATIENT SCREENING TOOL  Person answering questions: ____________***______ _____   1.  Is the patient or any family member in the home showing any signs or symptoms regarding respiratory infection?               Person with Symptom- __________NA_________________  a. Fever                                                                          Yes___ No___          ___________________  b. Shortness of breath                                                    Yes___ No___          ___________________ c. Cough/congestion                                        Yes___  No___         ___________________ d. Body aches/pains  Yes___ No___        ____________________ e. Gastrointestinal symptoms (diarrhea, nausea)           Yes___ No___        ____________________  2. Within the past 14 days, has anyone living in the home had any contact with someone with or under investigation for COVID-19?    Yes___ No_X_   Person __________________

## 2020-02-03 DIAGNOSIS — Z8616 Personal history of COVID-19: Secondary | ICD-10-CM

## 2020-02-03 HISTORY — DX: Personal history of COVID-19: Z86.16

## 2020-02-20 ENCOUNTER — Other Ambulatory Visit (HOSPITAL_COMMUNITY): Payer: Self-pay | Admitting: Internal Medicine

## 2020-02-20 ENCOUNTER — Ambulatory Visit: Payer: Medicare Other | Attending: Internal Medicine

## 2020-02-20 DIAGNOSIS — Z23 Encounter for immunization: Secondary | ICD-10-CM

## 2020-02-20 NOTE — Progress Notes (Signed)
   Covid-19 Vaccination Clinic  Name:  Tammy Gould    MRN: [id number] DOB: 25-Jun-1950  02/20/2020  Tammy Gould was observed post Covid-19 immunization for 15 minutes without incident. She was provided with Vaccine Information Sheet and instruction to access the V-Safe system.   Tammy Gould was instructed to call 911 with any severe reactions post vaccine: Marland Kitchen Difficulty breathing  . Swelling of face and throat  . A fast heartbeat  . A bad rash all over body  . Dizziness and weakness   Immunizations Administered    Name Date Dose VIS Date Route   PFIZER Comrnaty(Gray TOP) Covid-19 Vaccine 02/20/2020 10:50 AM 0.3 mL 12/11/2019 Intramuscular   Manufacturer: [employer], [employer]   Lot: [id number]   NDC: [phone]

## 2020-02-24 ENCOUNTER — Other Ambulatory Visit (HOSPITAL_COMMUNITY): Payer: Self-pay | Admitting: Neurology

## 2020-02-24 ENCOUNTER — Other Ambulatory Visit: Payer: Self-pay | Admitting: Neurology

## 2020-02-24 DIAGNOSIS — G3184 Mild cognitive impairment, so stated: Secondary | ICD-10-CM

## 2020-03-10 ENCOUNTER — Other Ambulatory Visit: Payer: Self-pay

## 2020-03-10 ENCOUNTER — Ambulatory Visit (HOSPITAL_COMMUNITY)
Admission: RE | Admit: 2020-03-10 | Discharge: 2020-03-10 | Disposition: A | Discharge status: Home / Self Care | Payer: Medicare Other | Source: Ambulatory Visit | Attending: Neurology | Admitting: Neurology

## 2020-03-10 DIAGNOSIS — G3184 Mild cognitive impairment, so stated: Secondary | ICD-10-CM | POA: Diagnosis present

## 2020-04-09 DIAGNOSIS — Z8616 Personal history of COVID-19: Secondary | ICD-10-CM | POA: Insufficient documentation

## 2020-08-16 ENCOUNTER — Other Ambulatory Visit: Payer: Self-pay | Admitting: Obstetrics and Gynecology

## 2020-10-12 ENCOUNTER — Other Ambulatory Visit: Payer: Self-pay | Admitting: Obstetrics and Gynecology

## 2020-10-12 DIAGNOSIS — Z1231 Encounter for screening mammogram for malignant neoplasm of breast: Secondary | ICD-10-CM

## 2020-10-14 ENCOUNTER — Ambulatory Visit
Admission: RE | Admit: 2020-10-14 | Discharge: 2020-10-14 | Disposition: A | Discharge status: Home / Self Care | Payer: Medicare Other | Source: Ambulatory Visit | Attending: Obstetrics and Gynecology | Admitting: Obstetrics and Gynecology

## 2020-10-14 ENCOUNTER — Other Ambulatory Visit: Payer: Self-pay

## 2020-10-14 DIAGNOSIS — Z1231 Encounter for screening mammogram for malignant neoplasm of breast: Secondary | ICD-10-CM | POA: Insufficient documentation

## 2020-11-15 ENCOUNTER — Encounter: Payer: Self-pay | Admitting: Psychology

## 2020-12-16 ENCOUNTER — Ambulatory Visit: Payer: Self-pay | Admitting: General Surgery

## 2020-12-16 ENCOUNTER — Other Ambulatory Visit: Payer: Self-pay | Admitting: General Surgery

## 2020-12-16 DIAGNOSIS — K432 Incisional hernia without obstruction or gangrene: Secondary | ICD-10-CM

## 2020-12-16 NOTE — H&P (Signed)
PATIENT PROFILE: Tammy Gould is a 70 y.o. female who presents to the Clinic for consultation at the request of Tammy Gould for evaluation of incisional hernia.   PCP:  Tammy Pax, MD   HISTORY OF PRESENT ILLNESS: Tammy Gould reports she originally had a hernia after hysterectomy.  Incisional hernia repair was done on December 2019 by laparoscopic approach.  Then in May  2020 she had a CT scan showing recurrence of the ventral hernia.  At that point since he was no symptomatic to decided not to proceed with a second repair.  On September 2021 she had a second opinion for the ablation of the hernia repair.  Repair was offered but patient decided to continue probation at the moment as well.   Today the patient endorses that the hernia is getting bigger, getting more comfortable and the having appearance of deformity.  She denies any episode of abdominal distention nausea or vomiting.     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    OSA on CPAP 05/12/2014    Overview      12/18 study AHI 6, RDI 23        Hyperglycemia 11/11/2013    History of hepatitis B Unknown    Lumbar disc disease 05/05/2013    Cervical disc disease 05/05/2013      GENERAL REVIEW OF SYSTEMS:    General ROS: negative for - chills, fatigue, fever, weight gain or weight loss Allergy and Immunology ROS: negative for - hives  Hematological and Lymphatic  ROS: negative for - bleeding problems or bruising, negative for palpable nodes Endocrine ROS: negative for - heat or cold intolerance, hair changes Respiratory ROS: negative for - cough, shortness of breath or wheezing Cardiovascular ROS: no chest pain or palpitations GI ROS: negative for nausea, vomiting, abdominal pain, diarrhea, constipation Musculoskeletal ROS: negative for - joint swelling or muscle pain Neurological ROS: negative for - confusion, syncope Dermatological ROS: negative for pruritus and rash Psychiatric: negative for anxiety, depression, difficulty sleeping and memory loss   MEDICATIONS: Current Medications        Current Outpatient Medications  Medication Sig Dispense Refill   acetaminophen (TYLENOL) 325 MG tablet Take 650 mg by mouth every 6 (six) hours as needed for Pain          ALPRAZolam (XANAX) 0.25 MG tablet Take 1 tablet (0.25 mg total) by mouth once daily as needed for Sleep 30 tablet 5   cholecalciferol (VITAMIN D3) 400 unit tablet Take 1 tablet by mouth once daily          clindamycin (CLEOCIN) 150 MG capsule Take 4 capsules by mouth as directed 1 hour  prior to dental procedure       cyanocobalamin (VITAMIN B12) 1000 MCG tablet Take 1,000 mcg by mouth once daily       DULoxetine (CYMBALTA) 30 MG DR capsule Take 1 capsule (30 mg total) by mouth once daily 90 capsule 3   ibuprofen (ADVIL,MOTRIN) 200 MG tablet Take 200 mg by mouth every 6 (six) hours as needed for Pain.        No current facility-administered medications for this visit.        ALLERGIES: Poison ivy [vit e-nonoxynol 9-aloe vera] and Penicillins   PAST MEDICAL HISTORY:     Past Medical History:  Diagnosis Date   Cervical disc disease 05/05/2013   Depression     History of cataract 01/06/2019   History of hepatitis B 1972   Hyperlipidemia 05/05/2013   Lumbar disc disease 05/05/2013   Mild dementia     OSA (obstructive sleep apnea) 05/05/2013   PONV (postoperative nausea and vomiting)      Wears hearing aid in both ears        PAST SURGICAL HISTORY:      Past Surgical History:  Procedure Laterality Date   ABDOMINAL HYSTERECTOMY   07/2016    Dr Vikki [place], bilateral salpingoophorectomy   APPENDECTOMY       Benign Breast Cysts removed bilaterally       CARPAL TUNNEL RELEASE       CATARACT EXTRACTION Bilateral 01/2019   COLONOSCOPY   05/26/2008    Dr. Keturah Barre. Wohl @ [facility] - Diverticulosis   COLONOSCOPY   06/25/2018    [facility] (Son) [facility]BF 06/2023   ENDOSCOPIC CARPAL TUNNEL RELEASE Bilateral     FRACTURE SURGERY   04/2012    Right wrist   HERNIA REPAIR       HYSTERECTOMY   07/2016    [facility], [facility] BSO ovarian torsion x3, benign cystadenoma   JOINT REPLACEMENT   02/19/2014    Hip replacement   KNEE ARTHROSCOPY Left     left knee surgery 2010       OOPHORECTOMY   07/21/2016   REPAIR VENTRAL HERNIA W/MESH N/A 12/27/2017   Right total hip replacement Right [id number]   TONSILLECTOMY       TUBAL LIGATION   1976      FAMILY HISTORY:      Family History  Problem Relation Age of Onset   Aneurysm Mother     High blood pressure (Hypertension) Mother     Osteoporosis (Thinning of bones) Mother     Osteoarthritis Mother     Coronary Artery Disease (Blocked arteries around heart) Father          Heart Disease   High blood pressure (Hypertension) Father     Osteoporosis (Thinning of bones) Father     Prostate cancer Father          Heart Disease   Bipolar disorder Brother     Bipolar disorder Son     Colon polyps Son     Breast cancer Maternal Aunt     Breast cancer Paternal Grandmother     Alzheimer's disease Paternal Grandmother          Cancer   Alzheimer's disease Maternal Aunt          Lewy Bodies   Colon polyps Maternal Aunt          Precancerous   Alzheimer's disease Maternal Aunt          Alzheimers   Alzheimer's disease Maternal  Uncle          Alzheimers   Alzheimer's disease Maternal Uncle          Alzheimers   Alzheimer's disease Maternal Uncle          Heart  Attack   Alzheimer's disease Brother          Bipolar   Bipolar disorder Brother     Depression Brother     Colon cancer Neg Hx        SOCIAL HISTORY: Social History           Socioeconomic History   Marital status: Widowed  Occupational History   Occupation: retired  Tobacco Use   Smoking status: Former      Packs/day: 1.00      Years: 15.00      Pack years: 15.00      Types: Cigarettes      Start date: 11/03/1970      Quit date: 01/11/1986      Years since quitting: 34.9   Smokeless tobacco: Never  Vaping Use   Vaping Use: Never used  Substance and Sexual Activity   Alcohol use: Yes      Alcohol/week: 0.0 standard drinks      Comment: Wine rarely   Drug use: Never   Sexual activity: Not Currently      Partners: Male      Birth control/protection: Post-menopausal        PHYSICAL EXAM:    Vitals:    12/16/20 0759  BP: 135/89  Pulse: 68    Body mass index is 30.86 kg/m. Weight: 80.3 kg (177 lb)    GENERAL: Alert, active, oriented x3   HEENT: Pupils equal reactive to light. Extraocular movements are intact. Sclera clear. Palpebral conjunctiva normal red color.Pharynx clear.   NECK: Supple with no palpable mass and no adenopathy.   LUNGS: Sound clear with no rales rhonchi or wheezes.   HEART: Regular rhythm S1 and S2 without murmur.   ABDOMEN: Soft and depressible, nontender with no palpable mass, no hepatomegaly.  Large bulge on midline,, with incarcerated fat.  Mild tender to palpation.  No abdominal distention.   EXTREMITIES: Well-developed well-nourished symmetrical with no dependent edema.   NEUROLOGICAL: Awake alert oriented, facial expression symmetrical, moving all extremities.   REVIEW OF DATA: I have reviewed the following data today:      Appointment on 12/08/2020  Component Date Value   WBC (White Blood Cell Co* 12/08/2020 3.8 (L)    RBC (Red Blood Cell Coun* 12/08/2020 4.17    Hemoglobin 12/08/2020 13.2    Hematocrit 12/08/2020 39.1     MCV (Mean Corpuscular Vo* 12/08/2020 93.8    MCH (Mean Corpuscular He* 12/08/2020 31.7 (H)    MCHC (Mean Corpuscular H* 12/08/2020 33.8    Platelet Count 12/08/2020 225    RDW-CV (Red Cell Distrib* 12/08/2020 12.7    MPV (Mean Platelet Volum* 12/08/2020 10.2    Neutrophils 12/08/2020 1.74    Lymphocytes 12/08/2020 1.58    Monocytes 12/08/2020 0.33    Eosinophils 12/08/2020 0.07    Basophils 12/08/2020 0.02    Neutrophil % 12/08/2020 46.4    Lymphocyte % 12/08/2020 42.1    Monocyte % 12/08/2020 8.8    Eosinophil % 12/08/2020 1.9    Basophil% 12/08/2020 0.5    Immature Granulocyte % 12/08/2020 0.3    Immature Granulocyte Cou* 12/08/2020 0.01    Glucose 12/08/2020 100    Sodium 12/08/2020 142    Potassium 12/08/2020  4.5    Chloride 12/08/2020 106    Carbon Dioxide (CO2) 12/08/2020 32.8 (H)    Urea Nitrogen (BUN) 12/08/2020 13    Creatinine 12/08/2020 0.7    Glomerular Filtration Ra* 12/08/2020 82    Calcium 12/08/2020 9.5    AST  12/08/2020 10    ALT  12/08/2020 10    Alk Phos (alkaline Phosp* 12/08/2020 53    Albumin 12/08/2020 4.3    Bilirubin, Total 12/08/2020 0.7    Protein, Total 12/08/2020 6.6    A/G Ratio 12/08/2020 1.9    Cholesterol, Total 12/08/2020 180    Triglyceride 12/08/2020 118    HDL (High Density Lipopr* 12/08/2020 39.0    LDL Calculated 12/08/2020 117    VLDL Cholesterol 12/08/2020 24    Cholesterol/HDL Ratio 12/08/2020 4.6    Hemoglobin A1C 12/08/2020 6.1 (H)    Average Blood Glucose (C* 12/08/2020 128    Magnesium 12/08/2020 2.0    Vitamin B12 12/08/2020 774    Color 12/08/2020 Yellow    Clarity 12/08/2020 Clear    Specific Gravity 12/08/2020 1.015    pH, Urine 12/08/2020 6.0    Protein, Urinalysis 12/08/2020 Negative    Glucose, Urinalysis 12/08/2020 Negative    Ketones, Urinalysis 12/08/2020 Negative    Blood, Urinalysis 12/08/2020 Negative    Nitrite, Urinalysis 12/08/2020 Negative    Leukocyte Esterase, Urin* 12/08/2020 Small (!)    White  Blood Cells, Urina* 12/08/2020 4-10 (!)    Red Blood Cells, Urinaly* 12/08/2020 None Seen    Bacteria, Urinalysis 12/08/2020 Rare (!)    Squamous Epithelial Cell* 12/08/2020 Rare    Thyroid Stimulating Horm* 12/08/2020 1.935       ASSESSMENT: Ms. Mahadeo is a 70 y.o. female presenting for consultation for incisional hernia.   Patient with a recurrent incisional hernia.  Initial incisional hernia came from a hysterectomy.  She had first repair on December 2019.  She endorses that in the last few months the hernia has been getting bigger and causing deformity.  He is also getting more symptomatic with more pain and aggravating with certain abdominal wall movement.   Due to the increased size of the hernia and the previous laparoscopic repair I recommended the patient to proceed with open incisional hernia repair with mesh.  I will get a CT scan of the abdomen and pelvis for complete evaluation of the hernia size and its content with concern of incarceration.  They will also help for planning and comparing if the best approach will be open versus laparoscopic surgery.  Patient oriented about the procedure including the risk such as pain, bleeding, infection, injury to intestine, intestinal fistula, hematoma, abscess and need of further surgeries.  The patient reports she understood and agreed to proceed.   Recurrent ventral incisional hernia [K43.2]   PLAN: CT scan of abdomen and pelvis for evaluation of enlarging hernia Repair of recurrent incisional hernia (94496, 49616) CBC, CMP done (12/08/20) Avoid taking aspirin 5 days before surgery   Patient verbalized understanding, all questions were answered, and were agreeable with the plan outlined above.        Herbert Pun, MD   Electronically signed by Herbert Pun, MD

## 2020-12-16 NOTE — H&P (View-Only) (Signed)
PATIENT PROFILE: Tammy Gould is a 70 y.o. female who presents to the Clinic for consultation at the request of Dr. Sabra Gould for evaluation of incisional hernia.   PCP:  Tammy Pax, MD   HISTORY OF PRESENT ILLNESS: Tammy Gould reports she originally had a hernia after hysterectomy.  Incisional hernia repair was done on December 2019 by laparoscopic approach.  Then in May  2020 she had a CT scan showing recurrence of the ventral hernia.  At that point since he was no symptomatic to decided not to proceed with a second repair.  On September 2021 she had a second opinion for the ablation of the hernia repair.  Repair was offered but patient decided to continue probation at the moment as well.   Today the patient endorses that the hernia is getting bigger, getting more comfortable and the having appearance of deformity.  She denies any episode of abdominal distention nausea or vomiting.     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    OSA on CPAP 05/12/2014    Overview      12/18 study AHI 6, RDI 23        Hyperglycemia 11/11/2013    History of hepatitis B Unknown    Lumbar disc disease 05/05/2013    Cervical disc disease 05/05/2013      GENERAL REVIEW OF SYSTEMS:    General ROS: negative for - chills, fatigue, fever, weight gain or weight loss Allergy and Immunology ROS: negative for - hives  Hematological and Lymphatic  ROS: negative for - bleeding problems or bruising, negative for palpable nodes Endocrine ROS: negative for - heat or cold intolerance, hair changes Respiratory ROS: negative for - cough, shortness of breath or wheezing Cardiovascular ROS: no chest pain or palpitations GI ROS: negative for nausea, vomiting, abdominal pain, diarrhea, constipation Musculoskeletal ROS: negative for - joint swelling or muscle pain Neurological ROS: negative for - confusion, syncope Dermatological ROS: negative for pruritus and rash Psychiatric: negative for anxiety, depression, difficulty sleeping and memory loss   MEDICATIONS: Current Medications        Current Outpatient Medications  Medication Sig Dispense Refill   acetaminophen (TYLENOL) 325 MG tablet Take 650 mg by mouth every 6 (six) hours as needed for Pain          ALPRAZolam (XANAX) 0.25 MG tablet Take 1 tablet (0.25 mg total) by mouth once daily as needed for Sleep 30 tablet 5   cholecalciferol (VITAMIN D3) 400 unit tablet Take 1 tablet by mouth once daily          clindamycin (CLEOCIN) 150 MG capsule Take 4 capsules by mouth as directed 1 hour  prior to dental procedure       cyanocobalamin (VITAMIN B12) 1000 MCG tablet Take 1,000 mcg by mouth once daily       DULoxetine (CYMBALTA) 30 MG DR capsule Take 1 capsule (30 mg total) by mouth once daily 90 capsule 3   ibuprofen (ADVIL,MOTRIN) 200 MG tablet Take 200 mg by mouth every 6 (six) hours as needed for Pain.        No current facility-administered medications for this visit.        ALLERGIES: Poison ivy [vit e-nonoxynol 9-aloe vera] and Penicillins   PAST MEDICAL HISTORY:     Past Medical History:  Diagnosis Date   Cervical disc disease 05/05/2013   Depression     History of cataract 01/06/2019   History of hepatitis B 1972   Hyperlipidemia 05/05/2013   Lumbar disc disease 05/05/2013   Mild dementia     OSA (obstructive sleep apnea) 05/05/2013   PONV (postoperative nausea and vomiting)      Wears hearing aid in both ears        PAST SURGICAL HISTORY:      Past Surgical History:  Procedure Laterality Date   ABDOMINAL HYSTERECTOMY   07/2016    Dr Tammy Gould, bilateral salpingoophorectomy   APPENDECTOMY       Benign Breast Cysts removed bilaterally       CARPAL TUNNEL RELEASE       CATARACT EXTRACTION Bilateral 01/2019   COLONOSCOPY   05/26/2008    Dr. Keturah Barre. Gould @ [place] - Diverticulosis   COLONOSCOPY   06/25/2018    [facility] (Tammy Gould) CBF 06/2023   ENDOSCOPIC CARPAL TUNNEL RELEASE Bilateral     FRACTURE SURGERY   04/2012    Right wrist   HERNIA REPAIR       HYSTERECTOMY   07/2016    [facility], [facility] BSO ovarian torsion x3, benign cystadenoma   JOINT REPLACEMENT   02/19/2014    Hip replacement   KNEE ARTHROSCOPY Left     left knee surgery 2010       OOPHORECTOMY   07/21/2016   REPAIR VENTRAL HERNIA W/MESH N/A 12/27/2017   Right total hip replacement Right [id number]   TONSILLECTOMY       TUBAL LIGATION   1976      FAMILY HISTORY:      Family History  Problem Relation Age of Onset   Aneurysm Mother     High blood pressure (Hypertension) Mother     Osteoporosis (Thinning of bones) Mother     Osteoarthritis Mother     Coronary Artery Disease (Blocked arteries around heart) Father          Heart Disease   High blood pressure (Hypertension) Father     Osteoporosis (Thinning of bones) Father     Prostate cancer Father          Heart Disease   Bipolar disorder Brother     Bipolar disorder Tammy Gould     Colon polyps Tammy Gould     Breast cancer Maternal Aunt     Breast cancer Paternal Grandmother     Alzheimer's disease Paternal Grandmother          Cancer   Alzheimer's disease Maternal Aunt          Lewy Bodies   Colon polyps Maternal Aunt          Precancerous   Alzheimer's disease Maternal Aunt          Alzheimers   Alzheimer's disease Maternal  Uncle          Alzheimers   Alzheimer's disease Maternal Uncle          Alzheimers   Alzheimer's disease Maternal Uncle          Heart  Attack   Alzheimer's disease Brother          Bipolar   Bipolar disorder Brother     Depression Brother     Colon cancer Neg Hx        SOCIAL HISTORY: Social History           Socioeconomic History   Marital status: Widowed  Occupational History   Occupation: retired  Tobacco Use   Smoking status: Former      Packs/day: 1.00      Years: 15.00      Pack years: 15.00      Types: Cigarettes      Start date: 11/03/1970      Quit date: 01/11/1986      Years since quitting: 34.9   Smokeless tobacco: Never  Vaping Use   Vaping Use: Never used  Substance and Sexual Activity   Alcohol use: Yes      Alcohol/week: 0.0 standard drinks      Comment: Wine rarely   Drug use: Never   Sexual activity: Not Currently      Partners: Male      Birth control/protection: Post-menopausal        PHYSICAL EXAM:    Vitals:    12/16/20 0759  BP: 135/89  Pulse: 68    Body mass index is 30.86 kg/m. Weight: 80.3 kg (177 lb)    GENERAL: Alert, active, oriented x3   HEENT: Pupils equal reactive to light. Extraocular movements are intact. Sclera clear. Palpebral conjunctiva normal red color.Pharynx clear.   NECK: Supple with no palpable mass and no adenopathy.   LUNGS: Sound clear with no rales rhonchi or wheezes.   HEART: Regular rhythm S1 and S2 without murmur.   ABDOMEN: Soft and depressible, nontender with no palpable mass, no hepatomegaly.  Large bulge on midline,, with incarcerated fat.  Mild tender to palpation.  No abdominal distention.   EXTREMITIES: Well-developed well-nourished symmetrical with no dependent edema.   NEUROLOGICAL: Awake alert oriented, facial expression symmetrical, moving all extremities.   REVIEW OF DATA: I have reviewed the following data today:      Appointment on 12/08/2020  Component Date Value   WBC (White Blood Cell Co* 12/08/2020 3.8 (L)    RBC (Red Blood Cell Coun* 12/08/2020 4.17    Hemoglobin 12/08/2020 13.2    Hematocrit 12/08/2020 39.1     MCV (Mean Corpuscular Vo* 12/08/2020 93.8    MCH (Mean Corpuscular He* 12/08/2020 31.7 (H)    MCHC (Mean Corpuscular H* 12/08/2020 33.8    Platelet Count 12/08/2020 225    RDW-CV (Red Cell Distrib* 12/08/2020 12.7    MPV (Mean Platelet Volum* 12/08/2020 10.2    Neutrophils 12/08/2020 1.74    Lymphocytes 12/08/2020 1.58    Monocytes 12/08/2020 0.33    Eosinophils 12/08/2020 0.07    Basophils 12/08/2020 0.02    Neutrophil % 12/08/2020 46.4    Lymphocyte % 12/08/2020 42.1    Monocyte % 12/08/2020 8.8    Eosinophil % 12/08/2020 1.9    Basophil% 12/08/2020 0.5    Immature Granulocyte % 12/08/2020 0.3    Immature Granulocyte Cou* 12/08/2020 0.01    Glucose 12/08/2020 100    Sodium 12/08/2020 142    Potassium 12/08/2020  4.5    Chloride 12/08/2020 106    Carbon Dioxide (CO2) 12/08/2020 32.8 (H)    Urea Nitrogen (BUN) 12/08/2020 13    Creatinine 12/08/2020 0.7    Glomerular Filtration Ra* 12/08/2020 82    Calcium 12/08/2020 9.5    AST  12/08/2020 10    ALT  12/08/2020 10    Alk Phos (alkaline Phosp* 12/08/2020 53    Albumin 12/08/2020 4.3    Bilirubin, Total 12/08/2020 0.7    Protein, Total 12/08/2020 6.6    A/G Ratio 12/08/2020 1.9    Cholesterol, Total 12/08/2020 180    Triglyceride 12/08/2020 118    HDL (High Density Lipopr* 12/08/2020 39.0    LDL Calculated 12/08/2020 117    VLDL Cholesterol 12/08/2020 24    Cholesterol/HDL Ratio 12/08/2020 4.6    Hemoglobin A1C 12/08/2020 6.1 (H)    Average Blood Glucose (C* 12/08/2020 128    Magnesium 12/08/2020 2.0    Vitamin B12 12/08/2020 774    Color 12/08/2020 Yellow    Clarity 12/08/2020 Clear    Specific Gravity 12/08/2020 1.015    pH, Urine 12/08/2020 6.0    Protein, Urinalysis 12/08/2020 Negative    Glucose, Urinalysis 12/08/2020 Negative    Ketones, Urinalysis 12/08/2020 Negative    Blood, Urinalysis 12/08/2020 Negative    Nitrite, Urinalysis 12/08/2020 Negative    Leukocyte Esterase, Urin* 12/08/2020 Small (!)    White  Blood Cells, Urina* 12/08/2020 4-10 (!)    Red Blood Cells, Urinaly* 12/08/2020 None Seen    Bacteria, Urinalysis 12/08/2020 Rare (!)    Squamous Epithelial Cell* 12/08/2020 Rare    Thyroid Stimulating Horm* 12/08/2020 1.935       ASSESSMENT: Ms. Flammia is a 70 y.o. female presenting for consultation for incisional hernia.   Patient with a recurrent incisional hernia.  Initial incisional hernia came from a hysterectomy.  She had first repair on December 2019.  She endorses that in the last few months the hernia has been getting bigger and causing deformity.  He is also getting more symptomatic with more pain and aggravating with certain abdominal wall movement.   Due to the increased size of the hernia and the previous laparoscopic repair I recommended the patient to proceed with open incisional hernia repair with mesh.  I will get a CT scan of the abdomen and pelvis for complete evaluation of the hernia size and its content with concern of incarceration.  They will also help for planning and comparing if the best approach will be open versus laparoscopic surgery.  Patient oriented about the procedure including the risk such as pain, bleeding, infection, injury to intestine, intestinal fistula, hematoma, abscess and need of further surgeries.  The patient reports she understood and agreed to proceed.   Recurrent ventral incisional hernia [K43.2]   PLAN: CT scan of abdomen and pelvis for evaluation of enlarging hernia Repair of recurrent incisional hernia (53748, 49616) CBC, CMP done (12/08/20) Avoid taking aspirin 5 days before surgery   Patient verbalized understanding, all questions were answered, and were agreeable with the plan outlined above.        Herbert Pun, MD   Electronically signed by Herbert Pun, MD

## 2020-12-20 ENCOUNTER — Other Ambulatory Visit: Payer: Medicare Other

## 2020-12-20 ENCOUNTER — Other Ambulatory Visit: Payer: Self-pay

## 2020-12-20 ENCOUNTER — Encounter
Admission: RE | Admit: 2020-12-20 | Discharge: 2020-12-20 | Disposition: A | Discharge status: Home / Self Care | Payer: Medicare Other | Source: Ambulatory Visit | Attending: General Surgery | Admitting: General Surgery

## 2020-12-20 NOTE — Patient Instructions (Signed)
Your procedure is scheduled on: 12/31/20 Report to [place]. To find out your arrival time please call [phone] between 1PM - 3PM on 12/30/20.  Remember: Instructions that are not followed completely may result in serious medical risk, up to and including death, or upon the discretion of your surgeon and anesthesiologist your surgery may need to be rescheduled.     _X__ 1. Do not eat food after midnight the night before your procedure.                 No gum chewing or hard candies.   __X__2.  On the morning of surgery brush your teeth with toothpaste and water, you                 may rinse your mouth with mouthwash if you wish.  Do not swallow any              toothpaste of mouthwash.     _X__ 3.  No Alcohol for 24 hours before or after surgery.   _X__ 4.  Do Not Smoke or use e-cigarettes For 24 Hours Prior to Your Surgery.                 Do not use any chewable tobacco products for at least 6 hours prior to                 surgery.  ____  5.  Bring all medications with you on the day of surgery if instructed.   __X__  6.  Notify your doctor if there is any change in your medical condition      (cold, fever, infections).     Do not wear jewelry, make-up, hairpins, clips or nail polish. Do not wear lotions, powders, or perfumes.  Do not shave body hair 48 hours prior to surgery. Men may shave face and neck. Do not bring valuables to the hospital.    [facility] is not responsible for any belongings or valuables.  Contacts, dentures/partials or body piercings may not be worn into surgery. Bring a case for your contacts, glasses or hearing aids, a denture cup will be supplied. Leave your suitcase in the car. After surgery it may be brought to your room. For patients admitted to the hospital, discharge time is determined by your treatment team.   Patients discharged the day of surgery will not be allowed to drive  home.   Please read over the following fact sheets that you were given:   Chg soap  __X__ Take these medicines the morning of surgery with A SIP OF WATER:    1. DULoxetine (CYMBALTA) 30 MG capsule  2. ALPRAZolam (XANAX) 0.25 MG tablet  3.   4.  5.  6.  ____ Fleet Enema (as directed)   __X__ Use CHG Soap/SAGE wipes as directed  ____ Use inhalers on the day of surgery  ____ Stop metformin/Janumet/Farxiga 2 days prior to surgery    ____ Take 1/2 of usual insulin dose the night before surgery. No insulin the morning          of surgery.   ____ Stop Blood Thinners Coumadin/Plavix/Xarelto/Pleta/Pradaxa/Eliquis/Effient/Aspirin  on   Or contact your Surgeon, Cardiologist or Medical Doctor regarding  ability to stop your blood thinners  __X__ Stop Anti-inflammatories 7 days before surgery such as Advil, Ibuprofen, Motrin,  BC or Goodies Powder, Naprosyn, Naproxen, Aleve, Aspirin    __X__ Stop all herbals  and supplements, fish oil or vitamins E until after surgery.    ____ Bring C-Pap to the hospital.

## 2020-12-21 ENCOUNTER — Encounter
Admission: RE | Admit: 2020-12-21 | Discharge: 2020-12-21 | Disposition: A | Discharge status: Home / Self Care | Payer: Medicare Other | Source: Ambulatory Visit | Attending: General Surgery | Admitting: General Surgery

## 2020-12-21 ENCOUNTER — Ambulatory Visit
Admission: RE | Admit: 2020-12-21 | Discharge: 2020-12-21 | Disposition: A | Discharge status: Home / Self Care | Payer: Medicare Other | Source: Ambulatory Visit | Attending: General Surgery | Admitting: General Surgery

## 2020-12-21 DIAGNOSIS — K432 Incisional hernia without obstruction or gangrene: Secondary | ICD-10-CM | POA: Diagnosis present

## 2020-12-21 DIAGNOSIS — Z01810 Encounter for preprocedural cardiovascular examination: Secondary | ICD-10-CM | POA: Insufficient documentation

## 2020-12-21 NOTE — Progress Notes (Signed)
Perioperative Services Pre-Admission/Anesthesia Testing   Date: 12/21/20 Name: Tammy Gould MRN:   [id number]  Re: Consideration of preoperative prophylactic antibiotic change   Request sent to: Herbert Pun, MD (routed and/or faxed via [facility])  Planned Surgical Procedure(s):    Case: [id number] Date/Time: 12/31/20 0715   Procedure: HERNIA REPAIR INCISIONAL (Abdomen)   Anesthesia type: General   Pre-op diagnosis: K43.9 recurrent ventral incisional hernia   Location: [facility] OR ROOM 04 / [facility] FOR [facility]   Surgeons: Herbert Pun, MD   Clinical Notes:  Patient has a documented allergy to PCN  The actual reaction to PCN is unknown.  Per Bryson Dames, "I think I was a teenager the last time I took penicillin and dont remember what the reaction was.  Certainly not bad enough to go to the hospital.  My mother was a nurse and said I should never take it again so I never did.  Thank you for looking at my record really good though".   Screened as appropriate for cephalosporin use during medication reconciliation No immediate angioedema, dysphagia, SOB, anaphylaxis symptoms. No severe rash involving mucous membranes or skin necrosis. No hospital admissions related to side effects of PCN/cephalosporin use.  No documented reaction to PCN or cephalosporin in the last 10 years.  Request:  As an evidence based approach to reducing the rate of incidence for post-operative SSI and the development of MDROs, could an agent with narrower coverage for preoperative prophylaxis in this patient's upcoming surgical course be considered?   Currently ordered preoperative prophylactic ABX: clindamycin.   Specifically requesting change to cephalosporin (CEFAZOLIN).   Please communicate decision with me and I will change the orders in Epic as per your direction.   Things to consider: Many patients report that they were "allergic" to PCN earlier in life, however this does not  translate into a true lifelong allergy. Patients can lose sensitivity to specific IgE antibodies over time if PCN is avoided (Kleris & Lugar, 2019).  Up to 10% of the adult population and 15% of hospitalized patients report an allergy to PCN, however clinical studies suggest that 90% of those reporting an allergy can tolerate PCN antibiotics (Kleris & Lugar, 2019).  Cross-sensitivity between PCN and cephalosporins has been documented as being as high as 10%, however this estimation included data believed to have been collected in a setting where there was contamination. Newer data suggests that the prevalence of cross-sensitivity between PCN and cephalosporins is actually estimated to be closer to 1% (Hermanides et al., 2018).   Patients labeled as PCN allergic, whether they are truly allergic or not, have been found to have inferior outcomes in terms of rates of serious infection, and these patients tend to have longer hospital stays (Kittredge, 2019).  Treatment related secondary infections, such as Clostridioides difficile, have been linked to the improper use of broad spectrum antibiotics in patients improperly labeled as PCN allergic (Kleris & Lugar, 2019).  Anaphylaxis from cephalosporins is rare and the evidence suggests that there is no increased risk of an anaphylactic type reaction when cephalosporins are used in a PCN allergic patient (Pichichero, 2006).  Citations: Hermanides J, Lemkes BA, Prins Pearla Dubonnet MW, Terreehorst I. Presumed ?-Lactam Allergy and Cross-reactivity in the Operating Theater: A Practical Approach. Anesthesiology. 2018 Aug;129(2):335-342. doi: 10.1097/ALN.0000000000002252. PMID: 51761607.  Kleris, [place]., & Lugar, P. L. (2019). Things We Do For No Reason: Failing to Question a Penicillin Allergy History. Journal of hospital medicine, 14(10), 571 248 7506. Advance online  publication. https://www.wallace-middleton.info/  Pichichero, M. E. (2006). Cephalosporins can be  prescribed safely for penicillin-allergic patients. Journal of family medicine, 55(2), 106-112. Accessed: https://cdn.mdedge.com/files/s11fs-public/Document/September-2017/5502JFP_AppliedEvidence1.pdf   Honor Loh, MSN, APRN, FNP-C, CEN [facility]  Peri-operative Services Nurse Practitioner FAX: [phone] 12/21/20 5:07 PM

## 2020-12-22 NOTE — Progress Notes (Signed)
°  Perioperative Services Pre-Admission/Anesthesia Testing     Date: 12/22/20  Name: Tammy Gould MRN:   [id number]  Re: Change in [place] for upcoming surgery   Case: [id number] Date/Time: 12/31/20 0715   Procedure: HERNIA REPAIR INCISIONAL (Abdomen)   Anesthesia type: General   Pre-op diagnosis: K43.9 recurrent ventral incisional hernia   Location: [facility] OR ROOM 04 / [facility]   Surgeons: Herbert Pun, MD   Primary attending surgeon was consulted regarding consideration of therapeutic change in antimicrobial agent being used for preoperative prophylaxis in this patient's upcoming surgical case. Following analysis of the risk versus benefits, the patient's primary attending surgeon advised that it would be acceptable to discontinue the ordered clindamycin and place an order for cefazolin 2 gm IV on call to the OR. Orders for this patient were amended by me following collaborative conversation with attending surgeon taking into consideration of risk versus benefits associated with the change in therapy.  Honor Loh, MSN, APRN, FNP-C, CEN [facility]  Peri-operative Services Nurse Practitioner Phone: [phone] 12/22/20 8:49 AM

## 2020-12-29 ENCOUNTER — Other Ambulatory Visit
Admission: RE | Admit: 2020-12-29 | Discharge: 2020-12-29 | Disposition: A | Discharge status: Home / Self Care | Payer: Medicare Other | Source: Ambulatory Visit | Attending: General Surgery | Admitting: General Surgery

## 2020-12-29 ENCOUNTER — Other Ambulatory Visit: Payer: Self-pay

## 2020-12-29 ENCOUNTER — Other Ambulatory Visit: Payer: Medicare Other

## 2020-12-29 DIAGNOSIS — Z20822 Contact with and (suspected) exposure to covid-19: Secondary | ICD-10-CM | POA: Insufficient documentation

## 2020-12-29 DIAGNOSIS — Z01812 Encounter for preprocedural laboratory examination: Secondary | ICD-10-CM | POA: Insufficient documentation

## 2020-12-29 LAB — SARS CORONAVIRUS 2 (TAT 6-24 HRS): SARS Coronavirus 2: NEGATIVE

## 2020-12-31 ENCOUNTER — Inpatient Hospital Stay: Payer: Medicare Other | Admitting: Urgent Care

## 2020-12-31 ENCOUNTER — Inpatient Hospital Stay
Admission: RE | Admit: 2020-12-31 | Discharge: 2021-01-02 | DRG: 355 | Disposition: A | Discharge status: Home / Self Care | Payer: Medicare Other | Source: Ambulatory Visit | Attending: General Surgery | Admitting: General Surgery

## 2020-12-31 ENCOUNTER — Encounter
Admission: RE | Disposition: A | Discharge status: Home / Self Care | Payer: Self-pay | Source: Ambulatory Visit | Attending: General Surgery

## 2020-12-31 ENCOUNTER — Encounter: Payer: Self-pay | Admitting: General Surgery

## 2020-12-31 ENCOUNTER — Other Ambulatory Visit: Payer: Self-pay

## 2020-12-31 DIAGNOSIS — G4733 Obstructive sleep apnea (adult) (pediatric): Secondary | ICD-10-CM | POA: Diagnosis present

## 2020-12-31 DIAGNOSIS — Z8616 Personal history of COVID-19: Secondary | ICD-10-CM

## 2020-12-31 DIAGNOSIS — Z8249 Family history of ischemic heart disease and other diseases of the circulatory system: Secondary | ICD-10-CM

## 2020-12-31 DIAGNOSIS — F3341 Major depressive disorder, recurrent, in partial remission: Secondary | ICD-10-CM | POA: Diagnosis present

## 2020-12-31 DIAGNOSIS — Z90722 Acquired absence of ovaries, bilateral: Secondary | ICD-10-CM

## 2020-12-31 DIAGNOSIS — Z9079 Acquired absence of other genital organ(s): Secondary | ICD-10-CM | POA: Diagnosis not present

## 2020-12-31 DIAGNOSIS — Z974 Presence of external hearing-aid: Secondary | ICD-10-CM

## 2020-12-31 DIAGNOSIS — K430 Incisional hernia with obstruction, without gangrene: Secondary | ICD-10-CM | POA: Diagnosis present

## 2020-12-31 DIAGNOSIS — F03A0 Unspecified dementia, mild, without behavioral disturbance, psychotic disturbance, mood disturbance, and anxiety: Secondary | ICD-10-CM | POA: Diagnosis present

## 2020-12-31 DIAGNOSIS — Z79899 Other long term (current) drug therapy: Secondary | ICD-10-CM | POA: Diagnosis not present

## 2020-12-31 DIAGNOSIS — Z96641 Presence of right artificial hip joint: Secondary | ICD-10-CM | POA: Diagnosis present

## 2020-12-31 DIAGNOSIS — Z87891 Personal history of nicotine dependence: Secondary | ICD-10-CM

## 2020-12-31 DIAGNOSIS — E782 Mixed hyperlipidemia: Secondary | ICD-10-CM | POA: Diagnosis present

## 2020-12-31 DIAGNOSIS — Z880 Allergy status to penicillin: Secondary | ICD-10-CM

## 2020-12-31 DIAGNOSIS — K432 Incisional hernia without obstruction or gangrene: Secondary | ICD-10-CM | POA: Diagnosis present

## 2020-12-31 DIAGNOSIS — K5901 Slow transit constipation: Secondary | ICD-10-CM | POA: Diagnosis present

## 2020-12-31 DIAGNOSIS — Z90710 Acquired absence of both cervix and uterus: Secondary | ICD-10-CM

## 2020-12-31 DIAGNOSIS — Z20822 Contact with and (suspected) exposure to covid-19: Secondary | ICD-10-CM | POA: Diagnosis present

## 2020-12-31 DIAGNOSIS — Z888 Allergy status to other drugs, medicaments and biological substances status: Secondary | ICD-10-CM

## 2020-12-31 HISTORY — PX: INCISIONAL HERNIA REPAIR: SHX193

## 2020-12-31 HISTORY — DX: Incisional hernia without obstruction or gangrene: K43.2

## 2020-12-31 HISTORY — PX: INSERTION OF MESH: SHX5868

## 2020-12-31 SURGERY — REPAIR, HERNIA, INCISIONAL
Anesthesia: General | Site: Abdomen

## 2020-12-31 MED ORDER — FENTANYL CITRATE (PF) 100 MCG/2ML IJ SOLN
INTRAMUSCULAR | Status: AC
  Filled 2020-12-31: qty 2

## 2020-12-31 MED ORDER — ACETAMINOPHEN 500 MG PO TABS: 1000.0000 mg | ORAL_TABLET | ORAL | Status: AC

## 2020-12-31 MED ORDER — GABAPENTIN 300 MG PO CAPS: 300.0000 mg | ORAL_CAPSULE | ORAL | Status: AC

## 2020-12-31 MED ORDER — FENTANYL CITRATE (PF) 100 MCG/2ML IJ SOLN
INTRAMUSCULAR | Status: DC | PRN
  Administered 2020-12-31: 25 ug via INTRAVENOUS
  Administered 2020-12-31 (×3): 50 ug via INTRAVENOUS
  Administered 2020-12-31: 25 ug via INTRAVENOUS

## 2020-12-31 MED ORDER — VISTASEAL 10 ML SINGLE DOSE KIT
PACK | CUTANEOUS | Status: AC
  Filled 2020-12-31: qty 10

## 2020-12-31 MED ORDER — CEFAZOLIN SODIUM-DEXTROSE 2-4 GM/100ML-% IV SOLN
2.0000 g | Freq: Once | INTRAVENOUS | Status: AC
  Administered 2020-12-31: 08:00:00 2 g via INTRAVENOUS

## 2020-12-31 MED ORDER — 0.9 % SODIUM CHLORIDE (POUR BTL) OPTIME
TOPICAL | Status: DC | PRN
  Administered 2020-12-31: 11:00:00 200 mL
  Administered 2020-12-31: 11:00:00 150 mL

## 2020-12-31 MED ORDER — CELECOXIB 200 MG PO CAPS
ORAL_CAPSULE | ORAL | Status: AC
  Administered 2020-12-31: 07:00:00 200 mg via ORAL
  Filled 2020-12-31: qty 1

## 2020-12-31 MED ORDER — ONDANSETRON HCL 4 MG/2ML IJ SOLN
INTRAMUSCULAR | Status: AC
  Filled 2020-12-31: qty 2

## 2020-12-31 MED ORDER — ONDANSETRON 4 MG PO TBDP: 4.0000 mg | ORAL_TABLET | Freq: Four times a day (QID) | ORAL | Status: DC | PRN

## 2020-12-31 MED ORDER — PROPOFOL 500 MG/50ML IV EMUL
INTRAVENOUS | Status: AC
  Filled 2020-12-31: qty 50

## 2020-12-31 MED ORDER — ENOXAPARIN SODIUM 40 MG/0.4ML IJ SOSY: 40.0000 mg | PREFILLED_SYRINGE | Freq: Once | INTRAMUSCULAR | Status: AC

## 2020-12-31 MED ORDER — MEPERIDINE HCL 25 MG/ML IJ SOLN: 6.2500 mg | INTRAMUSCULAR | Status: DC | PRN

## 2020-12-31 MED ORDER — SODIUM CHLORIDE (PF) 0.9 % IJ SOLN
INTRAMUSCULAR | Status: AC
  Filled 2020-12-31: qty 50

## 2020-12-31 MED ORDER — ENOXAPARIN SODIUM 40 MG/0.4ML IJ SOSY
40.0000 mg | PREFILLED_SYRINGE | INTRAMUSCULAR | Status: DC
  Administered 2021-01-01 – 2021-01-02 (×2): 40 mg via SUBCUTANEOUS
  Filled 2020-12-31 (×2): qty 0.4

## 2020-12-31 MED ORDER — SODIUM CHLORIDE (PF) 0.9 % IJ SOLN
INTRAMUSCULAR | Status: DC | PRN
  Administered 2020-12-31: 100 mL

## 2020-12-31 MED ORDER — ORAL CARE MOUTH RINSE: 15.0000 mL | Freq: Once | OROMUCOSAL | Status: AC

## 2020-12-31 MED ORDER — PHENYLEPHRINE 40 MCG/ML (10ML) SYRINGE FOR IV PUSH (FOR BLOOD PRESSURE SUPPORT)
PREFILLED_SYRINGE | INTRAVENOUS | Status: DC | PRN
  Administered 2020-12-31 (×2): 80 ug via INTRAVENOUS

## 2020-12-31 MED ORDER — ONDANSETRON HCL 4 MG/2ML IJ SOLN
INTRAMUSCULAR | Status: DC | PRN
  Administered 2020-12-31: 4 mg via INTRAVENOUS

## 2020-12-31 MED ORDER — GABAPENTIN 300 MG PO CAPS
ORAL_CAPSULE | ORAL | Status: AC
  Administered 2020-12-31: 07:00:00 300 mg via ORAL
  Filled 2020-12-31: qty 1

## 2020-12-31 MED ORDER — VITAMIN B-12 1000 MCG PO TABS
1000.0000 ug | ORAL_TABLET | Freq: Every day | ORAL | Status: DC
  Administered 2020-12-31 – 2021-01-02 (×3): 1000 ug via ORAL
  Filled 2020-12-31 (×3): qty 1

## 2020-12-31 MED ORDER — EPHEDRINE SULFATE 50 MG/ML IJ SOLN
INTRAMUSCULAR | Status: DC | PRN
  Administered 2020-12-31: 5 mg via INTRAVENOUS
  Administered 2020-12-31: 10 mg via INTRAVENOUS
  Administered 2020-12-31 (×4): 5 mg via INTRAVENOUS
  Administered 2020-12-31: 10 mg via INTRAVENOUS
  Administered 2020-12-31: 5 mg via INTRAVENOUS

## 2020-12-31 MED ORDER — CHLORHEXIDINE GLUCONATE 0.12 % MT SOLN
OROMUCOSAL | Status: AC
  Administered 2020-12-31: 07:00:00 15 mL via OROMUCOSAL
  Filled 2020-12-31: qty 15

## 2020-12-31 MED ORDER — BUPIVACAINE LIPOSOME 1.3 % IJ SUSP
INTRAMUSCULAR | Status: AC
  Filled 2020-12-31: qty 20

## 2020-12-31 MED ORDER — ALPRAZOLAM 0.25 MG PO TABS
0.2500 mg | ORAL_TABLET | Freq: Every day | ORAL | Status: DC | PRN
  Administered 2020-12-31 – 2021-01-01 (×2): 0.25 mg via ORAL
  Filled 2020-12-31 (×2): qty 1

## 2020-12-31 MED ORDER — MIDAZOLAM HCL 2 MG/2ML IJ SOLN
INTRAMUSCULAR | Status: DC | PRN
  Administered 2020-12-31 (×2): 1 mg via INTRAVENOUS

## 2020-12-31 MED ORDER — FENTANYL CITRATE (PF) 100 MCG/2ML IJ SOLN
INTRAMUSCULAR | Status: AC
  Administered 2020-12-31: 12:00:00 25 ug via INTRAVENOUS
  Filled 2020-12-31: qty 2

## 2020-12-31 MED ORDER — PHENYLEPHRINE HCL-NACL 20-0.9 MG/250ML-% IV SOLN
INTRAVENOUS | Status: AC
  Filled 2020-12-31: qty 250

## 2020-12-31 MED ORDER — BUPIVACAINE LIPOSOME 1.3 % IJ SUSP: 20.0000 mL | Freq: Once | INTRAMUSCULAR | Status: DC

## 2020-12-31 MED ORDER — SODIUM CHLORIDE 0.9 % IV SOLN: INTRAVENOUS | Status: DC

## 2020-12-31 MED ORDER — ONDANSETRON HCL 4 MG/2ML IJ SOLN: 4.0000 mg | Freq: Four times a day (QID) | INTRAMUSCULAR | Status: DC | PRN

## 2020-12-31 MED ORDER — CHLORHEXIDINE GLUCONATE 0.12 % MT SOLN: 15.0000 mL | Freq: Once | OROMUCOSAL | Status: AC

## 2020-12-31 MED ORDER — BUPIVACAINE-EPINEPHRINE (PF) 0.5% -1:200000 IJ SOLN
INTRAMUSCULAR | Status: AC
  Filled 2020-12-31: qty 30

## 2020-12-31 MED ORDER — PANTOPRAZOLE SODIUM 40 MG IV SOLR
40.0000 mg | Freq: Every day | INTRAVENOUS | Status: DC
  Administered 2020-12-31 – 2021-01-01 (×2): 40 mg via INTRAVENOUS
  Filled 2020-12-31 (×2): qty 40

## 2020-12-31 MED ORDER — OXYCODONE HCL 5 MG PO TABS
ORAL_TABLET | ORAL | Status: AC
  Filled 2020-12-31: qty 1

## 2020-12-31 MED ORDER — ENOXAPARIN SODIUM 40 MG/0.4ML IJ SOSY
PREFILLED_SYRINGE | INTRAMUSCULAR | Status: AC
  Administered 2020-12-31: 07:00:00 40 mg via SUBCUTANEOUS
  Filled 2020-12-31: qty 0.4

## 2020-12-31 MED ORDER — CELECOXIB 200 MG PO CAPS
200.0000 mg | ORAL_CAPSULE | Freq: Two times a day (BID) | ORAL | Status: DC
  Administered 2020-12-31 – 2021-01-02 (×4): 200 mg via ORAL
  Filled 2020-12-31 (×5): qty 1

## 2020-12-31 MED ORDER — DEXAMETHASONE SODIUM PHOSPHATE 10 MG/ML IJ SOLN
INTRAMUSCULAR | Status: DC | PRN
  Administered 2020-12-31: 10 mg via INTRAVENOUS

## 2020-12-31 MED ORDER — POLYETHYLENE GLYCOL 3350 17 G PO PACK
17.0000 g | PACK | Freq: Every day | ORAL | Status: DC
  Administered 2020-12-31 – 2021-01-02 (×3): 17 g via ORAL
  Filled 2020-12-31 (×3): qty 1

## 2020-12-31 MED ORDER — PROPOFOL 10 MG/ML IV BOLUS
INTRAVENOUS | Status: AC
  Filled 2020-12-31: qty 20

## 2020-12-31 MED ORDER — GABAPENTIN 300 MG PO CAPS
300.0000 mg | ORAL_CAPSULE | Freq: Two times a day (BID) | ORAL | Status: DC
  Administered 2020-12-31 – 2021-01-02 (×4): 300 mg via ORAL
  Filled 2020-12-31 (×4): qty 1

## 2020-12-31 MED ORDER — MORPHINE SULFATE (PF) 4 MG/ML IV SOLN: 4.0000 mg | INTRAVENOUS | Status: DC | PRN

## 2020-12-31 MED ORDER — PROPOFOL 10 MG/ML IV BOLUS
INTRAVENOUS | Status: DC | PRN
  Administered 2020-12-31: 150 mg via INTRAVENOUS

## 2020-12-31 MED ORDER — FENTANYL CITRATE (PF) 100 MCG/2ML IJ SOLN
25.0000 ug | INTRAMUSCULAR | Status: DC | PRN
  Administered 2020-12-31: 12:00:00 25 ug via INTRAVENOUS

## 2020-12-31 MED ORDER — MIDAZOLAM HCL 2 MG/2ML IJ SOLN
INTRAMUSCULAR | Status: AC
  Filled 2020-12-31: qty 2

## 2020-12-31 MED ORDER — CEFAZOLIN SODIUM-DEXTROSE 2-4 GM/100ML-% IV SOLN
INTRAVENOUS | Status: AC
  Filled 2020-12-31: qty 100

## 2020-12-31 MED ORDER — ACETAMINOPHEN 500 MG PO TABS
ORAL_TABLET | ORAL | Status: AC
  Administered 2020-12-31: 07:00:00 1000 mg via ORAL
  Filled 2020-12-31: qty 2

## 2020-12-31 MED ORDER — LIDOCAINE HCL (CARDIAC) PF 100 MG/5ML IV SOSY
PREFILLED_SYRINGE | INTRAVENOUS | Status: DC | PRN
  Administered 2020-12-31: 80 mg via INTRAVENOUS

## 2020-12-31 MED ORDER — OXYCODONE HCL 5 MG PO TABS
5.0000 mg | ORAL_TABLET | Freq: Once | ORAL | Status: AC
  Administered 2020-12-31: 13:00:00 5 mg via ORAL

## 2020-12-31 MED ORDER — ONDANSETRON HCL 4 MG/2ML IJ SOLN
4.0000 mg | Freq: Once | INTRAMUSCULAR | Status: AC | PRN
  Administered 2020-12-31: 12:00:00 4 mg via INTRAVENOUS

## 2020-12-31 MED ORDER — PROPOFOL 500 MG/50ML IV EMUL
INTRAVENOUS | Status: AC
  Filled 2020-12-31: qty 100

## 2020-12-31 MED ORDER — SUGAMMADEX SODIUM 200 MG/2ML IV SOLN
INTRAVENOUS | Status: DC | PRN
Start: 2020-12-31 — End: 2020-12-31
  Administered 2020-12-31: 200 mg via INTRAVENOUS

## 2020-12-31 MED ORDER — DULOXETINE HCL 30 MG PO CPEP
30.0000 mg | ORAL_CAPSULE | Freq: Every day | ORAL | Status: DC
  Administered 2020-12-31 – 2021-01-02 (×3): 30 mg via ORAL
  Filled 2020-12-31 (×3): qty 1

## 2020-12-31 MED ORDER — CELECOXIB 200 MG PO CAPS: 200.0000 mg | ORAL_CAPSULE | ORAL | Status: AC

## 2020-12-31 MED ORDER — VISTASEAL 10 ML SINGLE DOSE KIT
PACK | CUTANEOUS | Status: DC | PRN
  Administered 2020-12-31: 10 mL via TOPICAL

## 2020-12-31 MED ORDER — METHOCARBAMOL 500 MG PO TABS: 500.0000 mg | ORAL_TABLET | Freq: Four times a day (QID) | ORAL | Status: DC | PRN

## 2020-12-31 MED ORDER — ROCURONIUM BROMIDE 100 MG/10ML IV SOLN
INTRAVENOUS | Status: DC | PRN
Start: 2020-12-31 — End: 2020-12-31
  Administered 2020-12-31 (×3): 20 mg via INTRAVENOUS
  Administered 2020-12-31: 50 mg via INTRAVENOUS
  Administered 2020-12-31: 20 mg via INTRAVENOUS

## 2020-12-31 MED ORDER — PROPOFOL 500 MG/50ML IV EMUL
INTRAVENOUS | Status: DC | PRN
  Administered 2020-12-31: 20 ug/kg/min via INTRAVENOUS

## 2020-12-31 MED ORDER — LACTATED RINGERS IV SOLN: INTRAVENOUS | Status: DC

## 2020-12-31 MED ORDER — HYDROCODONE-ACETAMINOPHEN 5-325 MG PO TABS
1.0000 | ORAL_TABLET | ORAL | Status: DC | PRN
  Administered 2021-01-01 – 2021-01-02 (×4): 1 via ORAL
  Filled 2020-12-31 (×4): qty 1

## 2020-12-31 SURGICAL SUPPLY — 49 items
BINDER ABDOMINAL 12 ML 46-62 (SOFTGOODS) ×1 IMPLANT
BLADE SURG 15 STRL LF DISP TIS (BLADE) ×2 IMPLANT
BLADE SURG 15 STRL SS (BLADE) ×1
BULB RESERV EVAC DRAIN JP 100C (MISCELLANEOUS) ×1 IMPLANT
CANISTER WOUND CARE 500ML ATS (WOUND CARE) ×1 IMPLANT
CHLORAPREP W/TINT 26 (MISCELLANEOUS) ×4 IMPLANT
CNTNR SPEC 2.5X3XGRAD LEK (MISCELLANEOUS) ×2
CONT SPEC 4OZ STER OR WHT (MISCELLANEOUS) ×1
CONTAINER SPEC 2.5X3XGRAD LEK (MISCELLANEOUS) IMPLANT
DERMABOND ADVANCED (GAUZE/BANDAGES/DRESSINGS) ×1
DERMABOND ADVANCED .7 DNX12 (GAUZE/BANDAGES/DRESSINGS) ×2 IMPLANT
DRAPE INCISE IOBAN 66X45 STRL (DRAPES) ×1 IMPLANT
DRAPE LAPAROTOMY 100X77 ABD (DRAPES) ×3 IMPLANT
ELECT BLADE 6.5 EXT (BLADE) ×1 IMPLANT
ELECT REM PT RETURN 9FT ADLT (ELECTROSURGICAL) ×3
ELECTRODE REM PT RTRN 9FT ADLT (ELECTROSURGICAL) ×2 IMPLANT
GAUZE 4X4 16PLY ~~LOC~~+RFID DBL (SPONGE) ×3 IMPLANT
GLOVE SURG ENC MOIS LTX SZ6.5 (GLOVE) ×3 IMPLANT
GLOVE SURG UNDER POLY LF SZ6.5 (GLOVE) ×3 IMPLANT
GOWN STRL REUS W/ TWL LRG LVL3 (GOWN DISPOSABLE) ×2 IMPLANT
GOWN STRL REUS W/TWL LRG LVL3 (GOWN DISPOSABLE) ×1
KIT PREVENA INCISION MGT 13 (CANNISTER) IMPLANT
KIT PREVENA INCISION MGT20CM45 (CANNISTER) ×1 IMPLANT
KIT TURNOVER KIT A (KITS) ×3 IMPLANT
LABEL OR SOLS (LABEL) ×3 IMPLANT
MANIFOLD NEPTUNE II (INSTRUMENTS) ×3 IMPLANT
MESH SOFT 12X12IN BARD (Mesh General) ×1 IMPLANT
NDL HYPO 25X1 1.5 SAFETY (NEEDLE) ×2 IMPLANT
NEEDLE HYPO 22GX1.5 SAFETY (NEEDLE) ×1 IMPLANT
NEEDLE HYPO 25X1 1.5 SAFETY (NEEDLE) ×3 IMPLANT
NS IRRIG 500ML POUR BTL (IV SOLUTION) ×3 IMPLANT
PACK BASIN MINOR ARMC (MISCELLANEOUS) ×3 IMPLANT
SILICONE ROUND DRAIN JACKSON-PRATT HEMADUCT 19FR ×1 IMPLANT
SPONGE DRAIN TRACH 4X4 STRL 2S (GAUZE/BANDAGES/DRESSINGS) ×1 IMPLANT
SPONGE KITTNER 5P (MISCELLANEOUS) ×1 IMPLANT
SPONGE T-LAP 18X18 ~~LOC~~+RFID (SPONGE) ×4 IMPLANT
STAPLER SKIN PROX 35W (STAPLE) ×1 IMPLANT
SUT ETHILON 2 0 FS 18 (SUTURE) ×1 IMPLANT
SUT PDS AB 2-0 CT1 27 (SUTURE) ×5 IMPLANT
SUT PDS PLUS 0 (SUTURE) ×2
SUT PDS PLUS AB 0 CT-2 (SUTURE) IMPLANT
SUT VIC AB 3-0 SH 27 (SUTURE) ×1
SUT VIC AB 3-0 SH 27X BRD (SUTURE) IMPLANT
SUT VICRYL+ 3-0 36IN CT-1 (SUTURE) ×2 IMPLANT
SYR 10ML LL (SYRINGE) ×3 IMPLANT
SYR 30ML LL (SYRINGE) ×2 IMPLANT
TAPE CLOTH SURG 4X10 WHT LF (GAUZE/BANDAGES/DRESSINGS) ×1 IMPLANT
TRAY FOLEY SLVR 16FR LF STAT (SET/KITS/TRAYS/PACK) ×1 IMPLANT
WATER STERILE IRR 500ML POUR (IV SOLUTION) ×3 IMPLANT

## 2020-12-31 NOTE — Interval H&P Note (Signed)
History and Physical Interval Note:  12/31/2020 7:02 AM  Tammy Gould  has presented today for surgery, with the diagnosis of K43.9 recurrent ventral incisional hernia.  The various methods of treatment have been discussed with the patient and family. After consideration of risks, benefits and other options for treatment, the patient has consented to  Procedure(s): HERNIA REPAIR INCISIONAL (N/A) as a surgical intervention.  The patient's history has been reviewed, patient examined, no change in status, stable for surgery.  I have reviewed the patient's chart and labs.  Questions were answered to the patient's satisfaction.     Herbert Pun

## 2020-12-31 NOTE — Anesthesia Procedure Notes (Signed)
Procedure Name: Intubation Date/Time: 12/31/2020 7:44 AM Performed by: Leander Rams, CRNA Pre-anesthesia Checklist: Patient identified, Emergency Drugs available, Suction available, Patient being monitored and Timeout performed Patient Re-evaluated:Patient Re-evaluated prior to induction Oxygen Delivery Method: Circle system utilized Preoxygenation: Pre-oxygenation with 100% oxygen Induction Type: IV induction Ventilation: Mask ventilation without difficulty Laryngoscope Size: McGraph and 3 Grade View: Grade I Tube type: Oral Tube size: 7.0 mm Number of attempts: 1 Airway Equipment and Method: Stylet Placement Confirmation: ETT inserted through vocal cords under direct vision, positive ETCO2, CO2 detector and breath sounds checked- equal and bilateral Secured at: 21 cm Tube secured with: Tape Dental Injury: Teeth and Oropharynx as per pre-operative assessment

## 2020-12-31 NOTE — Progress Notes (Signed)
°   12/31/20 1315  Clinical Encounter Type  Visited With Patient  Visit Type Initial;Spiritual support;Social support  Referral From Chaplain  Consult/Referral To [employer] visited PT with nurse at bedside. PT had just returned from a procedure and seem to be winded and tired. PT stated, she had slight discomfort. Chaplain established initial spiritual care & offered support. Chaplain will FU at another time.   Andee Poles, M. Div.

## 2020-12-31 NOTE — Anesthesia Postprocedure Evaluation (Signed)
Anesthesia Post Note  Patient: Tammy Gould  Procedure(s) Performed: HERNIA REPAIR INCISIONAL (Abdomen) INSERTION OF MESH  Patient location during evaluation: PACU Anesthesia Type: General Level of consciousness: awake and alert, awake and oriented Pain management: pain level controlled Vital Signs Assessment: post-procedure vital signs reviewed and stable Respiratory status: spontaneous breathing, nonlabored ventilation and respiratory function stable Cardiovascular status: blood pressure returned to baseline and stable Postop Assessment: no apparent nausea or vomiting Anesthetic complications: no   No notable events documented.   Last Vitals:  Vitals:   12/31/20 1145 12/31/20 1200  BP: 122/74 122/77  Pulse: 87 74  Resp: 16 18  Temp:  (!) 36.2 C  SpO2: 96% 96%    Last Pain:  Vitals:   12/31/20 1200  TempSrc:   PainSc: 5                  Phill Mutter

## 2020-12-31 NOTE — Anesthesia Preprocedure Evaluation (Signed)
Anesthesia Evaluation  Patient identified by MRN, date of birth, ID band Patient awake    Reviewed: Allergy & Precautions, NPO status , Patient's Chart, lab work & pertinent test results  History of Anesthesia Complications (+) PONV and history of anesthetic complications  Airway Mallampati: II  TM Distance: >3 FB Neck ROM: Full    Dental  (+) Poor Dentition   Pulmonary sleep apnea and Continuous Positive Airway Pressure Ventilation , former smoker,    Pulmonary exam normal        Cardiovascular negative cardio ROS Normal cardiovascular exam     Neuro/Psych Anxiety negative neurological ROS  negative psych ROS   GI/Hepatic negative GI ROS, (+) Hepatitis -, B  Endo/Other  negative endocrine ROS  Renal/GU negative Renal ROS  negative genitourinary   Musculoskeletal  (+) Arthritis ,   Abdominal   Peds negative pediatric ROS (+)  Hematology negative hematology ROS (+)   Anesthesia Other Findings Anxiety    Arthritis  hands  Cancer ([place]) 2019 skin  Hepatitis B 1972 hep b antigen negative 07/20/2016  PONV (postoperative nausea and vomiting)   Pre-diabetes    Sleep apnea  uses cpap  Vertigo 01/2017 off and on for about 6 months. Relieved by [facility].  Wears hearing aid in both ears       Reproductive/Obstetrics negative OB ROS                            Anesthesia Physical Anesthesia Plan  ASA: 3  Anesthesia Plan: General   Post-op Pain Management:    Induction: Intravenous  PONV Risk Score and Plan: 2 and Propofol infusion, Ondansetron and Midazolam  Airway Management Planned: Oral ETT  Additional Equipment:   Intra-op Plan:   Post-operative Plan:   Informed Consent: I have reviewed the patients History and Physical, chart, labs and discussed the procedure including the risks, benefits and alternatives for the proposed anesthesia with the patient or authorized  representative who has indicated his/her understanding and acceptance.       Plan Discussed with: CRNA, Anesthesiologist and Surgeon  Anesthesia Plan Comments:         Anesthesia Quick Evaluation

## 2020-12-31 NOTE — Transfer of Care (Signed)
Immediate Anesthesia Transfer of Care Note  Patient: Tammy Gould  Procedure(s) Performed: HERNIA REPAIR INCISIONAL (Abdomen) INSERTION OF MESH  Patient Location: PACU  Anesthesia Type:General  Level of Consciousness: awake  Airway & Oxygen Therapy: Patient Spontanous Breathing  Post-op Assessment: Report given to RN  Post vital signs: stable  Last Vitals:  Vitals Value Taken Time  BP    Temp    Pulse 87 12/31/20 1144  Resp 16 12/31/20 1144  SpO2 96 % 12/31/20 1144  Vitals shown include unvalidated device data.  Last Pain:  Vitals:   12/31/20 0643  TempSrc: Temporal  PainSc: 0-No pain         Complications: No notable events documented.

## 2020-12-31 NOTE — Op Note (Addendum)
Preoperative diagnosis: Recurrent incisional hernia.  Postoperative diagnosis: Recurrent incisional hernia.  Procedure: Repair of incarcerated recurrent incisional hernia                     Bilateral Myocutaneous flaps                     Implantation of mesh                     Negative pressure dressing placement  Anesthesia: GETA  Surgeon: Dr. Windell Moment  Assistant Surgeon: Dr. Dahlia Byes (His assistance was needed due to the difficulty of the case being a large recurrent incisional hernia for exposure, manipulation of tissue, and assistance with decision making).   Wound Classification: Clean  Indications:Patient is a 70 y.o. female developed a ventral hernia in the site of a previous incision. This was symptomatic and incarcerated and repair was indicated.   Findings: 1. A 5 cm x 6 cm incisional hernia 2. A 30 cm x 20 cm light mesh used for repair 3. No hollow viscus organ injury identified during procedure 4. Tension free repair achieved 5. Adequate hemostasis  Description of procedure: The patient was brought to the operating room and general anesthesia was induced. A time-out was completed verifying correct patient, procedure, site, positioning, and implant(s) and/or special equipment prior to beginning this procedure. Antibiotics were administered prior to making the incision. The anterior abdominal wall was prepped and draped in the standard sterile fashion. A vertical midline incision incorporating the old incision was made. The old scar was completely excised. The incision was deepened to the fascia. The hernia sac was then identified and dissected free. The peritoneum of the sac was entered and the contents were reduced. The fascia was carefully palpated and additional defects were identified. Intervening fascial bridges were cut to create a single defect.  Adhesions to the underside of the abdominal wall were lysed and the fascia was assessed. Bilateral myocutaneous  (retrorectus Rive-Stoppa technique) flaps were created to release tension to be able to approximate the fascia to the midline. The assistant surgeon helped with the retraction of one side and with dissection of the other side. The midline fascia was dissected separating the anterior and posterior fascia creating a posterior component separation to further approximate the fascia to the midline. This was done from 10 cm cephalad to pubis caudal bilaterally. The peritoneum and posterior fascia was approximated at midline and closed with a running 2-0 PDS suture. A piece of 30 cm x 20 cm mesh was placed between the posterior fascia and the rectus muscle and sutured circumferentially to the anterior fascia with multiple 2-0 PDS interrupted sutures. Pensions consultant helped with fixation of the mesh. The anterior fascia was then closed with a running 0-PDS suture on midline. A closed suction drains were placed on the myocutaneous flaps and retrieved through a different incision. Dead space was irrigated with fibrin sealant subcutaneous tissues were closed with 3-0 Vicryl and the skin was closed with skin staples. A non durable negative pressure dressing was left placed covering the 19 x 2 cm incision.  The patient tolerated the procedure well and was brought to the postanesthesia care unit in stable condition.   Specimen: Hernia sac  Complications: None  Estimated Blood Loss: 100 mL

## 2021-01-01 ENCOUNTER — Encounter: Payer: Self-pay | Admitting: General Surgery

## 2021-01-01 NOTE — Progress Notes (Signed)
Patient ID: Tammy Gould, female   DOB: 1950-06-14, 70 y.o.   MRN: [id number]     [place] [facility] Day(s): 1.   Interval History: Patient seen and examined, no acute events or new complaints overnight. Patient reports feeling significant pain in the surgical area.  Pain is improved with pain medication.  Denies any nausea or vomiting.  Vital signs in last 24 hours: [min-max] current  Temp:  [97.2 F (36.2 C)-98.1 F (36.7 C)] 97.8 F (36.6 C) (12/31 0800) Pulse Rate:  [69-87] 78 (12/31 0800) Resp:  [16-19] 18 (12/31 0800) BP: (112-142)/(69-86) 142/85 (12/31 0800) SpO2:  [92 %-100 %] 95 % (12/31 0800)     Height: 5' 3.5" (161.3 cm) Weight: 79.8 kg BMI (Calculated): 30.68   Physical Exam:  Constitutional: alert, cooperative and no distress  Respiratory: breathing non-labored at rest  Cardiovascular: regular rate and sinus rhythm  Gastrointestinal: soft, moderate-tender, and non-distended  Labs:  CBC Latest Ref Rng & Units 01/09/2017 07/22/2016 07/21/2016  WBC 3.6 - 11.0 K/uL 2.9(L) 6.7 6.5  Hemoglobin 12.0 - 16.0 g/dL 12.6 10.9(L) 11.9(L)  Hematocrit 35.0 - 47.0 % 37.3 30.8(L) 34.3(L)  Platelets 150 - 440 K/uL 198 164 175   CMP Latest Ref Rng & Units 01/09/2017 07/22/2016 07/21/2016  Glucose 65 - 99 mg/dL 120(H) 127(H) 118(H)  BUN 6 - 20 mg/dL 13 10 10   Creatinine 0.44 - 1.00 mg/dL 0.60 0.48 0.45  Sodium 135 - 145 mmol/L 136 138 138  Potassium 3.5 - 5.1 mmol/L 3.8 3.5 3.4(L)  Chloride 101 - 111 mmol/L 105 107 105  CO2 22 - 32 mmol/L 25 25 27   Calcium 8.9 - 10.3 mg/dL 9.1 8.2(L) 8.5(L)  Total Protein 6.5 - 8.1 g/dL - - 5.7(L)  Total Bilirubin 0.3 - 1.2 mg/dL - - 0.9  Alkaline Phos 38 - 126 U/L - - 43  AST 15 - 41 U/L - - 17  ALT 14 - 54 U/L - - 13(L)    Imaging studies: No new pertinent imaging studies   Assessment/Plan:  70 y.o. female with recurrent incisional hernia 1 Day Post-Op s/p incisional hernia repair.  Patient with adequate vital signs.  Expected  postoperative pain.  We will continue with aggressive pain management.  We will advance her diet and discontinue IV fluids.  I encouraged the patient to ambulate.  Discharge planning once patient has pain controlled.  Currently needs to continue inpatient due to significant pain in need of IV pain medications.  Arnold Long, MD

## 2021-01-02 MED ORDER — HYDROCODONE-ACETAMINOPHEN 5-325 MG PO TABS: 1.0000 | ORAL_TABLET | ORAL | 0 refills | Status: DC | PRN

## 2021-01-02 NOTE — TOC Transition Note (Signed)
Transition of Care [facility]) - CM/SW Discharge Note   Patient Details  Name: Tammy Gould MRN: [id number] Date of Birth: 01-07-1950  [facility] CM/SW Contact:  Izola Price, RN Phone Number: 01/02/2021, 10:41 AM   Clinical Narrative:  Patient to be discharged today to home/self care. Per provider surgery notes: "Patient underwent recurrent incarcerated incisional hernia repair.  She has been going adequately.  Pain control.  Patient ambulating.  Patient tolerated diet.  Patient had a bowel movement.  Wound dry and clean". No other TOC needs identified at this time. Simmie Davies RN CM    Final next level of care: Home/Self Care Barriers to Discharge: Barriers Resolved   Patient Goals and CMS Choice     Choice offered to / list presented to : NA  Discharge Placement                       Discharge Plan and Services                DME Arranged: N/A DME Agency: NA       HH Arranged: NA          Social Determinants of Health (SDOH) Interventions     Readmission Risk Interventions No flowsheet data found.

## 2021-01-02 NOTE — Discharge Instructions (Signed)
°  Diet: Resume home heart healthy regular diet.   Activity: No heavy lifting >20 pounds (children, pets, laundry, garbage) or strenuous activity until follow-up, but light activity and walking are encouraged. Do not drive or drink alcohol if taking narcotic pain medications.  Wound care: May shower with soapy water and pat dry (do not rub incisions), but no baths or submerging incision underwater until follow-up. (no swimming)   Chart drain daily  Medications: Resume all home medications. For mild to moderate pain: acetaminophen (Tylenol) or ibuprofen (if no kidney disease). Combining Tylenol with alcohol can substantially increase your risk of causing liver disease. Narcotic pain medications, if prescribed, can be used for severe pain, though may cause nausea, constipation, and drowsiness. Do not combine Tylenol and Norco within a 6 hour period as Norco contains Tylenol. If you do not need the narcotic pain medication, you do not need to fill the prescription.  Call office [phone]) at any time if any questions, worsening pain, fevers/chills, bleeding, drainage from incision site, or other concerns.

## 2021-01-02 NOTE — Progress Notes (Signed)
Patient being discharged. Orders reviewed with patient including medications and dressing change. Patient and her daughter educated on how to empty JP drain and to document output. Due to holiday patient verbalized she will call and make her follow up appointment for Surgery.

## 2021-01-02 NOTE — Discharge Summary (Signed)
°  Patient ID: Tammy Gould MRN: [id number] DOB/AGE: 1950-12-30 71 y.o.  Admit date: 12/31/2020 Discharge date: 01/02/2021   Discharge Diagnoses:  Principal Problem:   Recurrent incisional hernia   Procedures: Recurrent incarcerated incisional hernia repair  Hospital Course: Patient underwent recurrent incarcerated incisional hernia repair.  She has been going adequately.  Pain control.  Patient ambulating.  Patient tolerated diet.  Patient had a bowel movement.  Wound dry and clean.  Physical Exam HENT:     Head: Normocephalic.  Cardiovascular:     Rate and Rhythm: Normal rate and regular rhythm.  Pulmonary:     Effort: Pulmonary effort is normal.     Breath sounds: Normal breath sounds.  Abdominal:     General: Abdomen is flat.  Musculoskeletal:     Cervical back: Normal range of motion.  Skin:    General: Skin is warm.     Capillary Refill: Capillary refill takes less than 2 seconds.  Neurological:     General: No focal deficit present.     Mental Status: She is alert and oriented to person, place, and time.     Consults: None  Disposition: Discharge disposition: 01-Home or Self Care       Discharge Instructions     Diet - low sodium heart healthy   Complete by: As directed    Increase activity slowly   Complete by: As directed       Allergies as of 01/02/2021       Reactions   Penicillins Hives   Has patient had a PCN reaction causing immediate rash, facial/tongue/throat swelling, SOB or lightheadedness with hypotension: Yes Has patient had a PCN reaction causing severe rash involving mucus membranes or skin necrosis: No Has patient had a PCN reaction that required hospitalization: No Has patient had a PCN reaction occurring within the last 10 years: No If all of the above answers are "NO", then may proceed with Cephalosporin use.        Medication List     TAKE these medications    Advil Dual Action 125-250 MG Tabs Generic drug:  Ibuprofen-Acetaminophen Take 2 tablets by mouth every 8 (eight) hours as needed (pain).   ALPRAZolam 0.25 MG tablet Commonly known as: XANAX Take 0.25 mg by mouth daily as needed for anxiety.   cholecalciferol 25 MCG (1000 UNIT) tablet Commonly known as: VITAMIN D3 Take 1,000 Units by mouth daily.   clindamycin 150 MG capsule Commonly known as: CLEOCIN Take 600 mg by mouth See admin instructions. Before dental procedures   DULoxetine 30 MG capsule Commonly known as: CYMBALTA Take 30 mg by mouth daily.   HYDROcodone-acetaminophen 5-325 MG tablet Commonly known as: NORCO/VICODIN Take 1 tablet by mouth every 4 (four) hours as needed for moderate pain.   ibuprofen 200 MG tablet Commonly known as: ADVIL Take 400-600 mg by mouth every 8 (eight) hours as needed for moderate pain.   Pfizer-BioNT COVID-19 Vac-TriS Susp injection Generic drug: COVID-19 mRNA Vac-TriS (Pfizer) USE AS DIRECTED   vitamin B-12 1000 MCG tablet Commonly known as: CYANOCOBALAMIN Take 1,000 mcg by mouth daily.        Follow-up Information     Herbert Pun, MD Follow up in 1 week(s).   Specialty: General Surgery Why: For wound re-check Contact information: [address] [place] [address] [phone]

## 2021-01-04 LAB — SURGICAL PATHOLOGY

## 2021-02-01 ENCOUNTER — Encounter: Payer: Self-pay | Admitting: General Surgery

## 2021-06-13 ENCOUNTER — Encounter: Payer: Medicare Other | Admitting: Psychology

## 2021-06-20 ENCOUNTER — Encounter: Payer: Medicare Other | Admitting: Psychology

## 2021-06-29 ENCOUNTER — Ambulatory Visit: Payer: Medicare Other | Admitting: Psychology

## 2021-06-29 ENCOUNTER — Encounter: Payer: Self-pay | Admitting: Psychology

## 2021-06-29 ENCOUNTER — Ambulatory Visit (INDEPENDENT_AMBULATORY_CARE_PROVIDER_SITE_OTHER): Payer: Medicare Other | Admitting: Psychology

## 2021-06-29 DIAGNOSIS — H9190 Unspecified hearing loss, unspecified ear: Secondary | ICD-10-CM | POA: Insufficient documentation

## 2021-06-29 DIAGNOSIS — R4189 Other symptoms and signs involving cognitive functions and awareness: Secondary | ICD-10-CM

## 2021-06-29 DIAGNOSIS — Z8619 Personal history of other infectious and parasitic diseases: Secondary | ICD-10-CM | POA: Insufficient documentation

## 2021-06-29 DIAGNOSIS — F330 Major depressive disorder, recurrent, mild: Secondary | ICD-10-CM | POA: Diagnosis not present

## 2021-06-29 DIAGNOSIS — R41840 Attention and concentration deficit: Secondary | ICD-10-CM

## 2021-06-29 DIAGNOSIS — F411 Generalized anxiety disorder: Secondary | ICD-10-CM | POA: Diagnosis not present

## 2021-06-29 NOTE — Progress Notes (Signed)
   Psychometrician Note   Cognitive testing was administered to [name] by Milana Kidney, B.S. (psychometrist) under the supervision of Dr. Christia Reading, Ph.D., licensed psychologist on 06/29/2021. Tammy Gould did not appear overtly distressed by the testing session per behavioral observation or responses across self-report questionnaires. Rest breaks were offered.    The battery of tests administered was selected by Dr. Christia Reading, Ph.D. with consideration to Tammy Gould's current level of functioning, the nature of her symptoms, emotional and behavioral responses during interview, level of literacy, observed level of motivation/effort, and the nature of the referral question. This battery was communicated to the psychometrist. Communication between Dr. Christia Reading, Ph.D. and the psychometrist was ongoing throughout the evaluation and Dr. Christia Reading, Ph.D. was immediately accessible at all times. Dr. Christia Reading, Ph.D. provided supervision to the psychometrist on the date of this service to the extent necessary to assure the quality of all services provided.    Tammy Gould will return within approximately 1-2 weeks for an interactive feedback session with Dr. Melvyn Novas at which time her test performances, clinical impressions, and treatment recommendations will be reviewed in detail. Tammy Gould understands she can contact our office should she require our assistance before this time.  A total of 160 minutes of billable time were spent face-to-face with Tammy Gould by the psychometrist. This includes both test administration and scoring time. Billing for these services is reflected in the clinical report generated by Dr. Christia Reading, Ph.D.  This note reflects time spent with the psychometrician and does not include test scores or any clinical interpretations made by Dr. Melvyn Novas. The full report will follow in a separate note.

## 2021-06-29 NOTE — Progress Notes (Unsigned)
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    Affective 8 --- Moderate       Additional Questionnaires:      Raw Score Percentile   PROMIS Sleep Disturbance Questionnaire: 23 --- None to Slight   Informed Consent and Coding/Compliance:   The current evaluation represents a clinical evaluation for the purposes previously outlined by the referral source and is in no way reflective of a forensic evaluation.   Tammy Gould. Jerrell was provided with a verbal description of the nature and purpose of the present neuropsychological evaluation. Also reviewed were the foreseeable risks and/or discomforts and benefits of the procedure, limits of confidentiality, and mandatory reporting requirements of this provider. The patient was given the opportunity to ask questions and receive answers about the evaluation. Oral consent to participate was provided by the patient.   This evaluation was conducted by Christia Reading, Ph.D.,  ABPP-CN, board certified clinical neuropsychologist. Tammy Gould. Silvey completed a clinical interview with Dr. Melvyn Novas, billed as one unit [phone], and 160 minutes of cognitive testing and scoring, billed as one unit [phone] and four additional units [id number]. Psychometrist Milana Kidney, B.S., assisted Dr.  Mitch Arquette with test administration and scoring procedures. As a separate and discrete service, Dr. Melvyn Novas spent a total of 160 minutes in interpretation and report writing billed as one unit [phone] and two units 96133.

## 2021-07-06 ENCOUNTER — Ambulatory Visit (INDEPENDENT_AMBULATORY_CARE_PROVIDER_SITE_OTHER): Payer: Medicare Other | Admitting: Psychology

## 2021-07-06 DIAGNOSIS — R41840 Attention and concentration deficit: Secondary | ICD-10-CM

## 2021-07-06 DIAGNOSIS — F330 Major depressive disorder, recurrent, mild: Secondary | ICD-10-CM

## 2021-07-06 DIAGNOSIS — F411 Generalized anxiety disorder: Secondary | ICD-10-CM

## 2021-07-06 NOTE — Progress Notes (Signed)
   Neuropsychology Feedback Session Tammy Gould. [facility]  Reason for Referral:   Tammy Gould is a 71 y.o. right-handed Caucasian female referred by  Jennings Books, M.D. , to characterize her current cognitive functioning and assist with diagnostic clarity and treatment planning in the context of subjective cognitive decline and strong family history of Alzheimer's disease.   Feedback:   Tammy Gould completed a comprehensive neuropsychological evaluation on 06/29/2021. Please refer to that encounter for the full report and recommendations. Briefly, results suggested neuropsychological functioning within normal limits relative to age-matched peers. She did exhibit an isolated weakness across a semantic set shifting task (D-KEFS Verbal Fluency). However, all other tasks assessing cognitive flexibility and executive functioning more broadly were appropriate, thus not creating cause for concern. Across mood-related questionnaires, she reported acute symptoms of mild depression and more moderate to severe anxiety. Ongoing psychiatric distress can certainly negatively influence memory and other cognitive abilities. Should there be underlying traits of ADHD, these symptoms would exacerbate these difficulties and worsen her day-to-day functioning further. At the present time, given current test performances, it is appropriate to theorize that the combination of these factors are the primary culprit for subjective day-to-day dysfunction.   Tammy Gould was unaccompanied during the current feedback session. Content of the current session focused on the results of her neuropsychological evaluation. Tammy Gould was given the opportunity to ask questions and her questions were answered. She was encouraged to reach out should additional questions arise. A copy of her report was provided at the conclusion of the visit.      20 minutes were spent conducting the current feedback  session with Tammy Gould, billed as one unit [phone].

## 2021-09-08 ENCOUNTER — Other Ambulatory Visit: Payer: Self-pay | Admitting: Obstetrics and Gynecology

## 2021-09-08 DIAGNOSIS — Z1231 Encounter for screening mammogram for malignant neoplasm of breast: Secondary | ICD-10-CM

## 2021-10-17 ENCOUNTER — Ambulatory Visit
Admission: RE | Admit: 2021-10-17 | Discharge: 2021-10-17 | Disposition: A | Discharge status: Home / Self Care | Payer: Medicare Other | Source: Ambulatory Visit | Attending: Obstetrics and Gynecology | Admitting: Obstetrics and Gynecology

## 2021-10-17 DIAGNOSIS — Z1231 Encounter for screening mammogram for malignant neoplasm of breast: Secondary | ICD-10-CM | POA: Insufficient documentation

## 2022-10-11 ENCOUNTER — Other Ambulatory Visit: Payer: Self-pay | Admitting: Internal Medicine

## 2022-10-11 DIAGNOSIS — Z1231 Encounter for screening mammogram for malignant neoplasm of breast: Secondary | ICD-10-CM

## 2022-10-25 ENCOUNTER — Ambulatory Visit
Admission: RE | Admit: 2022-10-25 | Discharge: 2022-10-25 | Disposition: A | Discharge status: Home / Self Care | Payer: Medicare Other | Source: Ambulatory Visit | Attending: Internal Medicine | Admitting: Internal Medicine

## 2022-10-25 DIAGNOSIS — Z1231 Encounter for screening mammogram for malignant neoplasm of breast: Secondary | ICD-10-CM | POA: Diagnosis present

## 2023-02-09 ENCOUNTER — Other Ambulatory Visit (HOSPITAL_BASED_OUTPATIENT_CLINIC_OR_DEPARTMENT_OTHER): Payer: Self-pay | Admitting: Cardiology

## 2023-02-09 DIAGNOSIS — Z800 Family history of malignant neoplasm of digestive organs: Secondary | ICD-10-CM

## 2023-02-09 LAB — LIPID PANEL
Cholesterol: 154
HDL: 45
LDL: 94
Triglycerides: 76 (ref 40–160)

## 2023-02-09 LAB — HEMOGLOBIN A1C: Hemoglobin A1C: 6

## 2023-02-09 NOTE — Progress Notes (Signed)
 Pt attended 02/09/23 event and everything screened was WNL. Pt did not indicate any SDOH needs at this event.  Further f/u to be scheduled per health equity protocol.

## 2023-02-12 ENCOUNTER — Ambulatory Visit
Admission: RE | Admit: 2023-02-12 | Discharge: 2023-02-12 | Disposition: A | Discharge status: Home / Self Care | Payer: Self-pay | Source: Ambulatory Visit | Attending: Cardiology | Admitting: Cardiology

## 2023-02-12 DIAGNOSIS — Z800 Family history of malignant neoplasm of digestive organs: Secondary | ICD-10-CM | POA: Insufficient documentation

## 2023-02-18 ENCOUNTER — Encounter: Payer: Self-pay | Admitting: Cardiology

## 2023-03-05 NOTE — Progress Notes (Unsigned)
 Pt attended 02/09/2023 screening event with BP of 122/75. Pt A1C was 6.0, pt is pre-diabetic. Pt noted at event that she does have a PCP. At event pt did not indicate any SDOH needs. Pt also noted that she is a former smoker and listed Medicaid as her insurance at the event.   Per chart review pt does have a PCP ([facility] [employer]), insurance, and is a former smoker. Pt's last appt with PCP was 12/21/2022. Pt has 4 upcoming appts with PCP, Endocrinology, and to do labs. Pt does not indicate any SDOH needs at this time.   No additional pt f/u to be scheduled at this time per health equity protocol.

## 2023-04-14 ENCOUNTER — Emergency Department: Admission: EM | Admit: 2023-04-14 | Discharge: 2023-04-14 | Disposition: A | Discharge status: Home / Self Care

## 2023-04-14 ENCOUNTER — Emergency Department

## 2023-04-14 ENCOUNTER — Other Ambulatory Visit: Payer: Self-pay

## 2023-04-14 DIAGNOSIS — I10 Essential (primary) hypertension: Secondary | ICD-10-CM | POA: Insufficient documentation

## 2023-04-14 DIAGNOSIS — R42 Dizziness and giddiness: Secondary | ICD-10-CM | POA: Diagnosis present

## 2023-04-14 HISTORY — DX: Essential (primary) hypertension: I10

## 2023-04-14 LAB — CBC
HCT: 39.7 % (ref 36.0–46.0)
Hemoglobin: 13.7 g/dL (ref 12.0–15.0)
MCH: 31.7 pg (ref 26.0–34.0)
MCHC: 34.5 g/dL (ref 30.0–36.0)
MCV: 91.9 fL (ref 80.0–100.0)
Platelets: 250 10*3/uL (ref 150–400)
RBC: 4.32 MIL/uL (ref 3.87–5.11)
RDW: 13 % (ref 11.5–15.5)
WBC: 6.3 10*3/uL (ref 4.0–10.5)
nRBC: 0 % (ref 0.0–0.2)

## 2023-04-14 LAB — URINALYSIS, ROUTINE W REFLEX MICROSCOPIC
Bilirubin Urine: NEGATIVE
Glucose, UA: NEGATIVE mg/dL
Hgb urine dipstick: NEGATIVE
Ketones, ur: NEGATIVE mg/dL
Leukocytes,Ua: NEGATIVE
Nitrite: NEGATIVE
Protein, ur: NEGATIVE mg/dL
Specific Gravity, Urine: 1.024 (ref 1.005–1.030)
pH: 5 (ref 5.0–8.0)

## 2023-04-14 LAB — TROPONIN I (HIGH SENSITIVITY): Troponin I (High Sensitivity): 4 ng/L (ref <18)

## 2023-04-14 LAB — BASIC METABOLIC PANEL WITH GFR
Anion gap: 8 (ref 5–15)
BUN: 18 mg/dL (ref 8–23)
CO2: 25 mmol/L (ref 22–32)
Calcium: 9.4 mg/dL (ref 8.9–10.3)
Chloride: 106 mmol/L (ref 98–111)
Creatinine, Ser: 0.63 mg/dL (ref 0.44–1.00)
GFR, Estimated: >60 mL/min (ref >60)
Glucose, Bld: 172 mg/dL — ABNORMAL HIGH (ref 70–99)
Potassium: 3.7 mmol/L (ref 3.5–5.1)
Sodium: 139 mmol/L (ref 135–145)

## 2023-04-14 MED ORDER — ONDANSETRON 4 MG PO TBDP: 4.0000 mg | ORAL_TABLET | Freq: Three times a day (TID) | ORAL | 0 refills | Status: AC | PRN

## 2023-04-14 MED ORDER — SODIUM CHLORIDE 0.9 % IV BOLUS
500.0000 mL | Freq: Once | INTRAVENOUS | Status: AC
  Administered 2023-04-14: 500 mL via INTRAVENOUS

## 2023-04-14 MED ORDER — MECLIZINE HCL 25 MG PO TABS
25.0000 mg | ORAL_TABLET | Freq: Once | ORAL | Status: AC
  Administered 2023-04-14: 25 mg via ORAL
  Filled 2023-04-14: qty 1

## 2023-04-14 MED ORDER — ONDANSETRON HCL 4 MG/2ML IJ SOLN
4.0000 mg | Freq: Once | INTRAMUSCULAR | Status: AC
  Administered 2023-04-14: 4 mg via INTRAVENOUS
  Filled 2023-04-14: qty 2

## 2023-04-14 MED ORDER — MECLIZINE HCL 12.5 MG PO TABS: 12.5000 mg | ORAL_TABLET | Freq: Three times a day (TID) | ORAL | 0 refills | Status: AC | PRN

## 2023-04-14 NOTE — ED Notes (Signed)
 EDP at [facility] for update, alert, NAD, calm, interactive. No changes.

## 2023-04-14 NOTE — ED Notes (Signed)
 Back from CT/xray, alert, NAD, calm, interactive, family at [facility].

## 2023-04-14 NOTE — ED Notes (Signed)
 EDP at [facility]

## 2023-04-14 NOTE — ED Notes (Signed)
EDP into room, at BS.  ?

## 2023-04-14 NOTE — ED Provider Notes (Signed)
 [facility] Provider Note    Event Date/Time   First MD Initiated Contact with Patient 04/14/23 1059     (approximate)   History   Dizziness  Pt to ED POV with son for dizziness since this morning. Denies unilateral weakness. Speech clear. Denies HA and vision changes. States has been having bilateral arm pain since 3 weeks ago when she fell and caught self with arms. Took oxycodone for arm pain PTA. States hx vertigo but has not taken meds for years.   HPI Tammy Gould is a 73 y.o. female PMH vertigo, MDD, hyperlipidemia, GAD, hypertension presents for evaluation of dizziness -Patient is primarily here for dizziness has been present since around 8 AM today.  Described as equilibrium.  Episodic, not constant--last 30 seconds to a few minutes at a time.  Not clearly exacerbated with specific head movement.  No recent underlying infectious symptoms including UTI symptoms.  No chest pain or shortness of breath.  No focal weakness, difficulty speaking. -No associated falls -Has had history of similar symptoms but was several years ago, thought to be related to "the crystals in her ear -No recent ear pain, ringing, discharge -Separately notes that she has had bilateral arm pain for the past month after mechanical fall which she broke with her bilateral arms.  Says she is being treated for hairline fracture in her left forearm after being seen at an orthopedic urgent care.  Per review of recent outpatient orthopedic notes, appears patient is being treated for a impacted and slightly displaced left radial head fracture.  Appears she has been struggling with bilateral shoulder pain since noon, they do have outpatient MRIs ordered to evaluate for rotator cuff injuries bilaterally.     Physical Exam   Triage Vital Signs: ED Triage Vitals  Encounter Vitals Group     BP 04/14/23 1044 130/88     Systolic BP Percentile --      Diastolic BP Percentile --      Pulse  Rate 04/14/23 1044 84     Resp 04/14/23 1044 20     Temp 04/14/23 1050 97.6 F (36.4 C)     Temp Source 04/14/23 1050 Oral     SpO2 04/14/23 1044 97 %     Weight 04/14/23 1045 180 lb (81.6 kg)     Height 04/14/23 1045 5\' 4"  (1.626 m)     Head Circumference --      Peak Flow --      Pain Score 04/14/23 1043 2     Pain Loc --      Pain Education --      Exclude from Growth Chart --     Most recent vital signs: Vitals:   04/14/23 1050 04/14/23 1103  BP:    Pulse:  81  Resp:  (!) 25  Temp: 97.6 F (36.4 C)   SpO2:  97%     General: Awake, no distress.  CV:  Good peripheral perfusion. RRR, RP 2+ Resp:  Normal effort. CTAB Abd:  No distention. Nontender to deep palpation throughout Ext:  BUE with full range of motion of R all joints, does note significant right upper arm and elbow pain with ranging of elbow and shoulder however.  No tenderness to point palpation throughout.  No deformities appreciated.  Radial pulse 2+. Neuro:  Aox4, CN II-XII intact, FNF wnl, finger taps fast b/l, 5/5 strength in bilateral finger extension/grip, arm flexion/extension, EHL/FHL. BUE AG 10+ sec no drift, BLE  AG 5+ sec no drift. Ambulates with steady gait. SILT. Negative Rhomberg.    ED Results / Procedures / Treatments   Labs (all labs ordered are listed, but only abnormal results are displayed) Labs Reviewed  BASIC METABOLIC PANEL WITH GFR - Abnormal; Notable for the following components:      Result Value   Glucose, Bld 172 (*)    All other components within normal limits  CBC  URINALYSIS, ROUTINE W REFLEX MICROSCOPIC  CBG MONITORING, ED  TROPONIN I (HIGH SENSITIVITY)     EKG  Ecg = sinus rhythm, rate 84, no ST elevation or depression, left anterior fascicular block present.  Normal intervals.   RADIOLOGY CT head interpreted by myself is unremarkable.  Radiology report reviewed.  PROCEDURES:  Critical Care performed: No  Procedures   MEDICATIONS ORDERED IN  ED: Medications  meclizine (ANTIVERT) tablet 25 mg (has no administration in time range)  sodium chloride 0.9 % bolus 500 mL (has no administration in time range)  ondansetron (ZOFRAN) injection 4 mg (has no administration in time range)     IMPRESSION / MDM / ASSESSMENT AND PLAN / ED COURSE  I reviewed the triage vital signs and the nursing notes.                              DDX/MDM/AP: Differential diagnosis includes, but is not limited to, peripheral etiology of disequilibrium/vertigo (BPPV, Mnire's, labyrinthitis), considered but doubt posterior stroke given intermittent nature of symptoms, consider underlying electrolyte abnormality or anemia.  Consider arrhythmia, doubt ACS but will screen with troponin and EKG.  With regard to right elbow and arm pain, appears to be subacute to chronic in nature, unclear why flaring today, no interval trauma.  Sounds as if patient did not have any imaging of her right upper arm after her fall, will screen to ensure no underlying fracture at this time.  Plan: -Labs -CT head -XR right elbow, humerus -Meclizine, small dose IV fluid, Zofran  Patient's presentation is most consistent with acute presentation with potential threat to life or bodily function.  The patient is on the cardiac monitor to evaluate for evidence of arrhythmia and/or significant heart rate changes.  ED course below.  Workup unremarkable, dizziness resolved with treatment, repeat exam with no ataxia.  CT head negative.  Laboratory workup unremarkable.  Presentation overall most consistent with peripheral etiology of vertigo, likely BPPV.  Rx meclizine.  Plan for PMD follow-up.  ED return precautions in place.  Patient and family bedside agree with plan.  Clinical Course as of 04/14/23 1525  Sat Apr 14, 2023  1211 Trop wnl, sx >3 hr (not indication for rpt), bmp wnl, cbc wnl [MM]  1212 CT head unremarkable on my interpretation, full radiology read below [MM]  1220 X-rays of  right elbow and humerus reviewed, unremarkable on my read.  Formal radiology read below.  IMPRESSION: Severe osteopenia.  Mild degenerative changes.  Subtle linear areas of density along the lateral aspect of the scapula. A subtle scapular injury is possible but this is age indeterminate   [MM]  1332 Reevaluated, feeling much better, will attempt ambulation trial shortly [MM]  1343 Patient reevaluated, ambulates with steady gait with no assistance, no ataxia.  Feeling much better and amenable to discharge home.  Overall suspect peripheral etiology of transient disequilibrium/vertigo consistent with patient's prior episode multiple years ago.  No evidence of acute pathology today, no clinical concern for posterior CVA  at this time based on reassuring reeval.  Will proceed with discharge home.  Rx meclizine and Zofran.  Plan for PMD follow-up.  ED return precautions in place.  Patient agrees with plan. [MM]    Clinical Course User Index [MM] Collis Deaner, MD     FINAL CLINICAL IMPRESSION(S) / ED DIAGNOSES   Final diagnoses:  None     Rx / DC Orders   ED Discharge Orders     None        Note:  This document was prepared using Dragon voice recognition software and may include unintentional dictation errors.   Collis Deaner, MD 04/14/23 [id number]

## 2023-04-14 NOTE — Discharge Instructions (Addendum)
 Your evaluation in the emergency department was overall reassuring, and I suspect you may have an inner ear problem contributing to your intermittent dizziness.  I prescribed you a medication to help with this is a nausea medication.  Please do follow-up with your primary care doctor for reevaluation, and return to the emergency department with any new or worsening symptoms.

## 2023-04-14 NOTE — ED Notes (Signed)
Up to br with assist

## 2023-04-14 NOTE — ED Notes (Signed)
Not in room, pt in radiology  

## 2023-04-14 NOTE — ED Notes (Signed)
 Walking with EDP, no ataxia noted. Gait improved. Still unsteady.

## 2023-04-14 NOTE — ED Triage Notes (Signed)
 Pt to ED POV with son for dizziness since this morning. Denies unilateral weakness. Speech clear. Denies HA and vision changes. States has been having bilateral arm pain since 3 weeks ago when she fell and caught self with arms. Took oxycodone for arm pain PTA. States hx vertigo but has not taken meds for years.

## 2023-05-25 ENCOUNTER — Emergency Department

## 2023-05-25 ENCOUNTER — Observation Stay
Admission: EM | Admit: 2023-05-25 | Discharge: 2023-05-27 | Disposition: A | Discharge status: Home / Self Care | Attending: Internal Medicine | Admitting: Internal Medicine

## 2023-05-25 ENCOUNTER — Other Ambulatory Visit: Payer: Self-pay

## 2023-05-25 DIAGNOSIS — Z87891 Personal history of nicotine dependence: Secondary | ICD-10-CM | POA: Diagnosis not present

## 2023-05-25 DIAGNOSIS — E539 Vitamin B deficiency, unspecified: Secondary | ICD-10-CM | POA: Diagnosis not present

## 2023-05-25 DIAGNOSIS — F411 Generalized anxiety disorder: Secondary | ICD-10-CM | POA: Diagnosis present

## 2023-05-25 DIAGNOSIS — F419 Anxiety disorder, unspecified: Secondary | ICD-10-CM | POA: Insufficient documentation

## 2023-05-25 DIAGNOSIS — G3184 Mild cognitive impairment, so stated: Secondary | ICD-10-CM | POA: Diagnosis not present

## 2023-05-25 DIAGNOSIS — K922 Gastrointestinal hemorrhage, unspecified: Secondary | ICD-10-CM | POA: Diagnosis not present

## 2023-05-25 DIAGNOSIS — E042 Nontoxic multinodular goiter: Secondary | ICD-10-CM | POA: Insufficient documentation

## 2023-05-25 DIAGNOSIS — Z96641 Presence of right artificial hip joint: Secondary | ICD-10-CM | POA: Insufficient documentation

## 2023-05-25 DIAGNOSIS — K625 Hemorrhage of anus and rectum: Secondary | ICD-10-CM | POA: Diagnosis present

## 2023-05-25 DIAGNOSIS — Z79899 Other long term (current) drug therapy: Secondary | ICD-10-CM | POA: Insufficient documentation

## 2023-05-25 DIAGNOSIS — K644 Residual hemorrhoidal skin tags: Secondary | ICD-10-CM | POA: Diagnosis not present

## 2023-05-25 DIAGNOSIS — E538 Deficiency of other specified B group vitamins: Secondary | ICD-10-CM | POA: Diagnosis present

## 2023-05-25 DIAGNOSIS — Z85828 Personal history of other malignant neoplasm of skin: Secondary | ICD-10-CM | POA: Diagnosis not present

## 2023-05-25 DIAGNOSIS — Z8616 Personal history of COVID-19: Secondary | ICD-10-CM | POA: Diagnosis not present

## 2023-05-25 DIAGNOSIS — D122 Benign neoplasm of ascending colon: Secondary | ICD-10-CM | POA: Diagnosis not present

## 2023-05-25 DIAGNOSIS — I10 Essential (primary) hypertension: Secondary | ICD-10-CM | POA: Insufficient documentation

## 2023-05-25 DIAGNOSIS — K5730 Diverticulosis of large intestine without perforation or abscess without bleeding: Secondary | ICD-10-CM | POA: Insufficient documentation

## 2023-05-25 LAB — FERRITIN: Ferritin: 38 ng/mL (ref 11–307)

## 2023-05-25 LAB — HEMOGLOBIN AND HEMATOCRIT, BLOOD
HCT: 37.3 % (ref 36.0–46.0)
Hemoglobin: 13 g/dL (ref 12.0–15.0)

## 2023-05-25 LAB — CBC
HCT: 40 % (ref 36.0–46.0)
Hemoglobin: 13.1 g/dL (ref 12.0–15.0)
MCH: 31.7 pg (ref 26.0–34.0)
MCHC: 32.8 g/dL (ref 30.0–36.0)
MCV: 96.9 fL (ref 80.0–100.0)
Platelets: 241 10*3/uL (ref 150–400)
RBC: 4.13 MIL/uL (ref 3.87–5.11)
RDW: 13.1 % (ref 11.5–15.5)
WBC: 5.7 10*3/uL (ref 4.0–10.5)
nRBC: 0 % (ref 0.0–0.2)

## 2023-05-25 LAB — TYPE AND SCREEN
ABO/RH(D): A POS
Antibody Screen: NEGATIVE

## 2023-05-25 LAB — COMPREHENSIVE METABOLIC PANEL WITH GFR
ALT: 24 U/L (ref 0–44)
AST: 20 U/L (ref 15–41)
Albumin: 4 g/dL (ref 3.5–5.0)
Alkaline Phosphatase: 47 U/L (ref 38–126)
Anion gap: 10 (ref 5–15)
BUN: 22 mg/dL (ref 8–23)
CO2: 24 mmol/L (ref 22–32)
Calcium: 9.3 mg/dL (ref 8.9–10.3)
Chloride: 105 mmol/L (ref 98–111)
Creatinine, Ser: 0.7 mg/dL (ref 0.44–1.00)
GFR, Estimated: >60 mL/min (ref >60)
Glucose, Bld: 158 mg/dL — ABNORMAL HIGH (ref 70–99)
Potassium: 3.8 mmol/L (ref 3.5–5.1)
Sodium: 139 mmol/L (ref 135–145)
Total Bilirubin: 0.6 mg/dL (ref 0.0–1.2)
Total Protein: 6.8 g/dL (ref 6.5–8.1)

## 2023-05-25 LAB — IRON AND TIBC
Iron: 70 ug/dL (ref 28–170)
Saturation Ratios: 20 % (ref 10.4–31.8)
TIBC: 353 ug/dL (ref 250–450)
UIBC: 283 ug/dL

## 2023-05-25 LAB — TSH: TSH: 1.255 u[IU]/mL (ref 0.350–4.500)

## 2023-05-25 LAB — PROTIME-INR
INR: 1 (ref 0.8–1.2)
Prothrombin Time: 13.6 s (ref 11.4–15.2)

## 2023-05-25 MED ORDER — DONEPEZIL HCL 5 MG PO TABS
5.0000 mg | ORAL_TABLET | Freq: Every day | ORAL | Status: DC
  Administered 2023-05-26: 5 mg via ORAL
  Filled 2023-05-25 (×2): qty 1

## 2023-05-25 MED ORDER — MECLIZINE HCL 25 MG PO TABS: 12.5000 mg | ORAL_TABLET | Freq: Three times a day (TID) | ORAL | Status: DC | PRN

## 2023-05-25 MED ORDER — ACETAMINOPHEN 325 MG PO TABS
650.0000 mg | ORAL_TABLET | Freq: Four times a day (QID) | ORAL | Status: DC | PRN
  Filled 2023-05-25: qty 2

## 2023-05-25 MED ORDER — AMLODIPINE BESYLATE 5 MG PO TABS: 5.0000 mg | ORAL_TABLET | Freq: Every day | ORAL | Status: DC

## 2023-05-25 MED ORDER — SENNOSIDES-DOCUSATE SODIUM 8.6-50 MG PO TABS: 1.0000 | ORAL_TABLET | Freq: Every evening | ORAL | Status: DC | PRN

## 2023-05-25 MED ORDER — ONDANSETRON HCL 4 MG PO TABS: 4.0000 mg | ORAL_TABLET | Freq: Four times a day (QID) | ORAL | Status: DC | PRN

## 2023-05-25 MED ORDER — ONDANSETRON HCL 4 MG/2ML IJ SOLN: 4.0000 mg | Freq: Four times a day (QID) | INTRAMUSCULAR | Status: DC | PRN

## 2023-05-25 MED ORDER — VITAMIN B-12 1000 MCG PO TABS
1000.0000 ug | ORAL_TABLET | Freq: Every day | ORAL | Status: DC
  Administered 2023-05-26 – 2023-05-27 (×2): 1000 ug via ORAL
  Filled 2023-05-25 (×2): qty 1

## 2023-05-25 MED ORDER — IOHEXOL 300 MG/ML  SOLN
100.0000 mL | Freq: Once | INTRAMUSCULAR | Status: AC | PRN
  Administered 2023-05-25: 100 mL via INTRAVENOUS

## 2023-05-25 MED ORDER — ACETAMINOPHEN 650 MG RE SUPP: 650.0000 mg | Freq: Four times a day (QID) | RECTAL | Status: DC | PRN

## 2023-05-25 NOTE — ED Notes (Signed)
 Pt ambulatory to the bathroom. Hat provided for stool sample.

## 2023-05-25 NOTE — ED Triage Notes (Signed)
 Pt sts that she has had four episodes of bright red bowel movements this AM.

## 2023-05-25 NOTE — H&P (Signed)
 History and Physical  Tammy Gould WGN:[id number] DOB: 02-02-1950 DOA: 05/25/2023  PCP: Sari Cunning, MD   Chief Complaint: Rectal bleeding  HPI: Tammy Gould is a 73 y.o. female with medical history significant for mild cognitive impairment, anxiety and depression, HLD, HTN, DDD, vitamin B12 deficiency, multinodular goiter, prediabetes and OSA who presented to the ED for evaluation of rectal bleeding. Patient reports that this morning, she went to the bathroom and was unable to have any bowel movement but noticed bright red blood in the toilet.  She did have 3 small subsequent bowel movements with blood mixed with diarrhea. She denies any history of GI bleed and not sure if she has had colonoscopy recently. She denies any anal pain, melena, hematuria, hematemesis, abdominal pain, nausea, vomiting, dizziness, chest pain, shortness of breath or headache.  ED Course: Initial vitals showed patient afebrile, normotensive with SpO2 of 98% on room air.  Initial labs significant for WBC 5.7, Hgb 13.1, PT/INR 13.6/1.0, normal kidney function and LFTs.  CT A/P does not show any active GI bleed or acute intraabdominal or pelvic abnormality.  TRH was consulted for admission.  Review of Systems: Please see HPI for pertinent positives and negatives. A complete 10 system review of systems are otherwise negative.  Past Medical History:  Diagnosis Date   Arthritis    hands   Cervical disc disease 05/05/2013   Generalized anxiety disorder    Hearing loss    Has, does not wear.   History of COVID-19 02/2020   History of hepatitis B 1972   hep b antigen negative 07/20/2016   History of skin cancer 2019   Hyperglycemia 11/11/2013   Hyperlipidemia, mixed 05/24/2015   Hypertension    Lumbar disc disease 05/05/2013   Major depressive disorder 05/24/2015   OSA on CPAP 05/12/2014   12/18 study AHI 6, RDI 23   Ovarian mass, left 07/19/2016   PONV (postoperative nausea and vomiting)    Pre-diabetes     Recurrent incisional hernia 12/31/2020   Slow transit constipation 05/16/2018   Vertigo 01/2017   off and on for about 6 months.  Relieved by [facility].   Vitamin B12 deficiency 11/30/2016   149, 2018 B12 injections   Past Surgical History:  Procedure Laterality Date   ABDOMINAL HYSTERECTOMY Bilateral 07/21/2016   Procedure: HYSTERECTOMY ABDOMINAL;  Surgeon: Ward, Margarie Shay, MD;  Location: [facility];  Service: Gynecology;  Laterality: Bilateral;   ABDOMINAL HYSTERECTOMY     APPENDECTOMY     BREAST EXCISIONAL BIOPSY Bilateral [id number]   Multiple biopsies (4), negative   CATARACT EXTRACTION W/PHACO Right 01/06/2019   Procedure: CATARACT EXTRACTION PHACO AND INTRAOCULAR LENS PLACEMENT (IOC) RIGHT DIABETIC SYMFONY LENS  8.03  52.8;  Surgeon: Rosa College, MD;  Location: [facility];  Service: Ophthalmology;  Laterality: Right;  sleep apnea   CATARACT EXTRACTION W/PHACO Left 01/27/2019   Procedure: CATARACT EXTRACTION PHACO AND INTRAOCULAR LENS PLACEMENT (IOC) LEFT DIABETIC SYMFONY LENS 3.39  00:35.9;  Surgeon: Rosa College, MD;  Location: [facility];  Service: Ophthalmology;  Laterality: Left;   INCISIONAL HERNIA REPAIR N/A 12/31/2020   Procedure: HERNIA REPAIR INCISIONAL;  Surgeon: Eldred Grego, MD;  Location: [facility];  Service: General;  Laterality: N/A;   INSERTION OF MESH  12/31/2020   Procedure: INSERTION OF MESH;  Surgeon: Eldred Grego, MD;  Location: [facility];  Service: General;;   SALPINGOOPHORECTOMY Bilateral 07/21/2016   Procedure: SALPINGO OOPHORECTOMY;  Surgeon: Myrene Ash, MD;  Location: [facility];  Service: Gynecology;  Laterality: Bilateral;   TONSILLECTOMY     TOTAL HIP ARTHROPLASTY Right 2015   VENTRAL HERNIA REPAIR N/A 12/27/2017   Procedure: LAPAROSCOPIC VENTRAL HERNIA REPAIR WITH MESH;  Surgeon: Conrado Delay, DO;  Location: [facility];  Service: General;  Laterality: N/A;   WRIST SURGERY Right 2012   plate and screw in  wrist   Social History:  reports that she quit smoking about 37 years ago. Her smoking use included cigarettes. She has never used smokeless tobacco. She reports that she does not currently use alcohol. She reports that she does not use drugs.  Allergies  Allergen Reactions   Penicillins Hives    Has patient had a PCN reaction causing immediate rash, facial/tongue/throat swelling, SOB or lightheadedness with hypotension: Yes Has patient had a PCN reaction causing severe rash involving mucus membranes or skin necrosis: No Has patient had a PCN reaction that required hospitalization: No Has patient had a PCN reaction occurring within the last 10 years: No If all of the above answers are "NO", then may proceed with Cephalosporin use.    Family History  Problem Relation Age of Onset   Alzheimer's disease Mother    Breast cancer Paternal Grandmother 31   Dementia Other        uncle; Lewy body disease   Alzheimer's disease Maternal Aunt        several family members   Alzheimer's disease Maternal Uncle        several family members     Prior to Admission medications   Medication Sig Start Date End Date Taking? Authorizing Provider  ALPRAZolam  (XANAX ) 0.25 MG tablet Take 0.25 mg by mouth daily as needed for anxiety.    [provider]  amLODipine (NORVASC) 5 MG tablet Take 5 mg by mouth daily.    [provider]  cholecalciferol (VITAMIN D3) 25 MCG (1000 UT) tablet Take 1,000 Units by mouth daily.    [provider]  clindamycin  (CLEOCIN ) 150 MG capsule Take 600 mg by mouth See admin instructions. Before dental procedures 07/27/20   [provider]  donepezil (ARICEPT) 5 MG tablet Take 5 mg by mouth at bedtime.    [provider]  DULoxetine  (CYMBALTA ) 30 MG capsule Take 30 mg by mouth daily.    [provider]  HYDROcodone -acetaminophen  (NORCO/VICODIN) 5-325 MG tablet Take 1 tablet by mouth every 4 (four) hours as needed for moderate  pain. 01/02/21   Eldred Grego, MD  ibuprofen  (ADVIL ) 200 MG tablet Take 400-600 mg by mouth every 8 (eight) hours as needed for moderate pain.    [provider]  Ibuprofen -Acetaminophen  (ADVIL  DUAL ACTION) 125-250 MG TABS Take 2 tablets by mouth every 8 (eight) hours as needed (pain).    [provider]  meclizine  (ANTIVERT ) 12.5 MG tablet Take 1 tablet (12.5 mg total) by mouth 3 (three) times daily as needed for dizziness. 04/14/23   Collis Deaner, MD  ondansetron  (ZOFRAN -ODT) 4 MG disintegrating tablet Take 1 tablet (4 mg total) by mouth every 8 (eight) hours as needed for nausea or vomiting. 04/14/23   Collis Deaner, MD  vitamin B-12 (CYANOCOBALAMIN ) 1000 MCG tablet Take 1,000 mcg by mouth daily.    [provider]    Physical Exam: BP (!) 139/94 (BP Location: Left Arm)   Pulse 79   Temp 98.2 F (36.8 C)   Resp 18   Ht 5\' 3"  (1.6 m)   Wt 78.5 kg  SpO2 96%   BMI 30.65 kg/m  General: Pleasant, well-appearing elderly woman laying in bed. No acute distress. HEENT: [place]/AT. Anicteric sclera CV: RRR. No murmurs, rubs, or gallops. No LE edema Pulmonary: Lungs CTAB. Normal effort. No wheezing or rales. Abdominal: Soft, nontender, nondistended. Normal bowel sounds. Extremities: Palpable radial and DP pulses. Normal ROM. Skin: Warm and dry. No obvious rash or lesions. Neuro: A&Ox3. Poor memory. Moves all extremities. Normal sensation to light touch. No focal deficit. Psych: Normal mood and affect          Labs on Admission:  Basic Metabolic Panel: Recent Labs  Lab 05/25/23 1139  NA 139  K 3.8  CL 105  CO2 24  GLUCOSE 158*  BUN 22  CREATININE 0.70  CALCIUM 9.3   Liver Function Tests: Recent Labs  Lab 05/25/23 1139  AST 20  ALT 24  ALKPHOS 47  BILITOT 0.6  PROT 6.8  ALBUMIN 4.0   No results for input(s): "LIPASE", "AMYLASE" in the last 168 hours. No results for input(s): "AMMONIA" in the last 168 hours. CBC: Recent Labs  Lab  05/25/23 1139  WBC 5.7  HGB 13.1  HCT 40.0  MCV 96.9  PLT 241   Cardiac Enzymes: No results for input(s): "CKTOTAL", "CKMB", "CKMBINDEX", "TROPONINI" in the last 168 hours. BNP (last 3 results) No results for input(s): "BNP" in the last 8760 hours.  ProBNP (last 3 results) No results for input(s): "PROBNP" in the last 8760 hours.  CBG: No results for input(s): "GLUCAP" in the last 168 hours.  Radiological Exams on Admission: CT ABDOMEN PELVIS W CONTRAST Result Date: 05/25/2023 CLINICAL DATA:  Diarrhea bright red bowel movement EXAM: CT ABDOMEN AND PELVIS WITH CONTRAST TECHNIQUE: Multidetector CT imaging of the abdomen and pelvis was performed using the standard protocol following bolus administration of intravenous contrast. RADIATION DOSE REDUCTION: This exam was performed according to the departmental dose-optimization program which includes automated exposure control, adjustment of the mA and/or kV according to patient size and/or use of iterative reconstruction technique. CONTRAST:  OMNIPAQUE IOHEXOL 300 MG/ML  SOLN COMPARISON:  CT 12/21/2020, 07/19/2016 FINDINGS: Lower chest: Lung bases demonstrate no acute airspace disease. Hepatobiliary: Stable low-density lesions in the liver consistent with cysts. Small gallstone. No biliary dilatation Pancreas: Unremarkable. No pancreatic ductal dilatation or surrounding inflammatory changes. Spleen: Normal in size without focal abnormality. Adrenals/Urinary Tract: Adrenal glands are normal. 5 mm left kidney stone. Mild to moderate hydronephrosis of the left kidney without hydroureter, this is stable as compared with 2022 exam. Urinary bladder is unremarkable Stomach/Bowel: Stomach nonenlarged. No dilated small bowel. Small hiatal hernia. Diverticular disease of the colon without acute wall thickening Vascular/Lymphatic: Aortic atherosclerosis. No enlarged abdominal or pelvic lymph nodes. Reproductive: Status post hysterectomy. No adnexal masses.  Other: Negative for pelvic effusion or free air. Fat hernias in the inguinal regions. Status post ventral hernia repair Musculoskeletal: No acute or suspicious osseous abnormality. IMPRESSION: 1. No CT evidence for acute intra-abdominal or pelvic abnormality. 2. 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 3. 5 mm left kidney stone. 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 4. Small gallstone 5. Aortic atherosclerosis. Aortic Atherosclerosis (ICD10-I70.0). Electronically Signed   By: Esmeralda Hedge M.D.   On: 05/25/2023 17:28   Assessment/Plan Tammy Gould is a 73 y.o. female with medical history significant for mild cognitive impairment, anxiety and depression, HLD, HTN, DDD, vitamin B12 deficiency,  multinodular goiter, prediabetes and OSA who presented to the ED for evaluation of rectal bleeding and admitted for GI bleed.  # GI bleed # Rectal bleeding - Patient with new onset of rectal bleeding described as bright red blood earlier today - Rectal exam performed by EDP showed nonthrombosed and nonbleeding external hemorrhoids - CT A/P shows some diverticulosis but no active GI bleed - Source of rectal bleeding likely diverticular bleed versus external hemorrhoid - Hgb has been stable at 13.1->13.0, normal iron studies - Patient remains hemodynamically stable with no abdominal pain - GI consulted, appreciate recs - Admit for close observation overnight - Trend H&H  # HTN - Blood pressure remained stable - Continue amlodipine  # Vitamin B12 deficiency - Hemoglobin remained stable, iron studies within normal limits - Follow-up vitamin B12 levels - Continue vitamin B12 supplementation  # Mild cognitive impairment - Patient with poor memory per daughter - Continued Aricept  # Multinodular goiter - Follows [facility] end[facility] - Repeat TSH within normal  limits  # Vertigo # BPPV - Continue as needed meclizine   # DDD - As needed Tylenol   # Anxiety and depression - Follow-up med rec to confirm with psych meds patient is currently taking  DVT prophylaxis: SCDs    Code Status: Full Code  Consults called: GI  Family Communication: Discussed admission with granddaughter at bedside  Severity of Illness: The appropriate patient status for this patient is OBSERVATION. Observation status is judged to be reasonable and necessary in order to provide the required intensity of service to ensure the patient's safety. The patient's presenting symptoms, physical exam findings, and initial radiographic and laboratory data in the context of their medical condition is felt to place them at decreased risk for further clinical deterioration. Furthermore, it is anticipated that the patient will be medically stable for discharge from the hospital within 2 midnights of admission.   Level of care: Med-Surg   This record has been created using Conservation officer, historic buildings. Errors have been sought and corrected, but may not always be located. Such creation errors do not reflect on the standard of care.   Vita Grip, MD 05/25/2023, 8:25 PM [facility] Pager: [phone] Isaiah 41:10   If 7PM-7AM, please contact night-coverage [email or web address] Password [email or web address]

## 2023-05-25 NOTE — ED Provider Notes (Signed)
 [facility] Provider Note    Event Date/Time   First MD Initiated Contact with Patient 05/25/23 1401     (approximate)   History   Diarrhea   HPI  Tammy Gould is a 73 y.o. female with history of hyperlipidemia, MDD, GAD, presenting with rectal bleeding.  Patient states that she went to her bathroom this morning, no stool, noticed blood in the toilet.  States that she had 3 subsequent bowel movements with blood mixed with diarrhea.  She denies any abdominal pain, fever, urinary symptoms, vaginal bleeding, nausea or vomiting.  States had a colonoscopy several years ago that was normal.  She denies prior history of GI bleed, no history of colon cancer or IBD.  On intermittent chart review, she was seen by primary care doctor and on May 21, denies any dysphagia or or hoarseness, was seen for multinodular goiter.     Physical Exam   Triage Vital Signs: ED Triage Vitals [05/25/23 1136]  Encounter Vitals Group     BP 131/85     Systolic BP Percentile      Diastolic BP Percentile      Pulse Rate 90     Resp 19     Temp 98.1 F (36.7 C)     Temp Source Oral     SpO2 98 %     Weight 173 lb (78.5 kg)     Height 5\' 3"  (1.6 m)     Head Circumference      Peak Flow      Pain Score 0     Pain Loc      Pain Education      Exclude from Growth Chart     Most recent vital signs: Vitals:   05/25/23 1136  BP: 131/85  Pulse: 90  Resp: 19  Temp: 98.1 F (36.7 C)  SpO2: 98%     General: Awake, no distress.  CV:  Good peripheral perfusion.  Resp:  Normal effort.  Abd:  No distention.  Soft nontender Other:  Rectal exam was done chaperone, external hemorrhoid noted, nonthrombosed, nonbleeding.  Patient had a bowel movement prior to rectal exam, hematochezia noted   ED Results / Procedures / Treatments   Labs (all labs ordered are listed, but only abnormal results are displayed) Labs Reviewed  COMPREHENSIVE METABOLIC PANEL WITH GFR - Abnormal;  Notable for the following components:      Result Value   Glucose, Bld 158 (*)    All other components within normal limits  CBC  PROTIME-INR  POC OCCULT BLOOD, ED  TYPE AND SCREEN      PROCEDURES:  Critical Care performed: No  Procedures   MEDICATIONS ORDERED IN ED: Medications  iohexol (OMNIPAQUE) 300 MG/ML solution 100 mL (100 mLs Intravenous Contrast Given 05/25/23 1506)     IMPRESSION / MDM / ASSESSMENT AND PLAN / ED COURSE  I reviewed the triage vital signs and the nursing notes.                              Differential diagnosis includes, but is not limited to, hemorrhoids, GI bleed, mass, diverticulitis.  Will get labs, CT abdomen pelvis.  Patient's presentation is most consistent with acute presentation with potential threat to life or bodily function.  Independent interpretation of labs and imaging below.  Signed out pending CT imaging results, likely needs admission for her GI bleed.  The patient  is on the cardiac monitor to evaluate for evidence of arrhythmia and/or significant heart rate changes.   Clinical Course as of 05/25/23 1512  Fri May 25, 2023  1511 Independent review of labs, electrolytes are severely deranged, LFTs are normal, H&H is stable, no leukocytosis. [TT]    Clinical Course User Index [TT] Drenda Gentle, Richard Champion, MD     FINAL CLINICAL IMPRESSION(S) / ED DIAGNOSES   Final diagnoses:  Gastrointestinal hemorrhage, unspecified gastrointestinal hemorrhage type     Rx / DC Orders   ED Discharge Orders     None        Note:  This document was prepared using Dragon voice recognition software and may include unintentional dictation errors.    Shane Darling, MD 05/25/23 [phone]

## 2023-05-26 DIAGNOSIS — K922 Gastrointestinal hemorrhage, unspecified: Secondary | ICD-10-CM | POA: Diagnosis not present

## 2023-05-26 DIAGNOSIS — I10 Essential (primary) hypertension: Secondary | ICD-10-CM | POA: Diagnosis not present

## 2023-05-26 DIAGNOSIS — G3184 Mild cognitive impairment, so stated: Secondary | ICD-10-CM | POA: Diagnosis not present

## 2023-05-26 DIAGNOSIS — E042 Nontoxic multinodular goiter: Secondary | ICD-10-CM

## 2023-05-26 DIAGNOSIS — K625 Hemorrhage of anus and rectum: Secondary | ICD-10-CM

## 2023-05-26 DIAGNOSIS — F411 Generalized anxiety disorder: Secondary | ICD-10-CM

## 2023-05-26 DIAGNOSIS — E538 Deficiency of other specified B group vitamins: Secondary | ICD-10-CM | POA: Diagnosis not present

## 2023-05-26 LAB — VITAMIN B12: Vitamin B-12: 254 pg/mL (ref 180–914)

## 2023-05-26 LAB — HEMOGLOBIN: Hemoglobin: 12.2 g/dL (ref 12.0–15.0)

## 2023-05-26 LAB — BASIC METABOLIC PANEL WITH GFR
Anion gap: 7 (ref 5–15)
BUN: 17 mg/dL (ref 8–23)
CO2: 25 mmol/L (ref 22–32)
Calcium: 9 mg/dL (ref 8.9–10.3)
Chloride: 109 mmol/L (ref 98–111)
Creatinine, Ser: 0.53 mg/dL (ref 0.44–1.00)
GFR, Estimated: >60 mL/min (ref >60)
Glucose, Bld: 110 mg/dL — ABNORMAL HIGH (ref 70–99)
Potassium: 3.9 mmol/L (ref 3.5–5.1)
Sodium: 141 mmol/L (ref 135–145)

## 2023-05-26 LAB — HEMOGLOBIN AND HEMATOCRIT, BLOOD
HCT: 33.9 % — ABNORMAL LOW (ref 36.0–46.0)
Hemoglobin: 11.7 g/dL — ABNORMAL LOW (ref 12.0–15.0)

## 2023-05-26 MED ORDER — POLYETHYLENE GLYCOL 3350 17 GM/SCOOP PO POWD
238.0000 g | Freq: Once | ORAL | Status: AC
  Administered 2023-05-26: 238 g via ORAL
  Filled 2023-05-26: qty 238

## 2023-05-26 MED ORDER — DULOXETINE HCL 30 MG PO CPEP
30.0000 mg | ORAL_CAPSULE | Freq: Every day | ORAL | Status: DC
  Administered 2023-05-26 – 2023-05-27 (×2): 30 mg via ORAL
  Filled 2023-05-26 (×2): qty 1

## 2023-05-26 MED ORDER — PANTOPRAZOLE SODIUM 40 MG PO TBEC
40.0000 mg | DELAYED_RELEASE_TABLET | Freq: Every day | ORAL | Status: DC
  Administered 2023-05-26 – 2023-05-27 (×2): 40 mg via ORAL
  Filled 2023-05-26 (×2): qty 1

## 2023-05-26 MED ORDER — TRAMADOL HCL 50 MG PO TABS
50.0000 mg | ORAL_TABLET | Freq: Four times a day (QID) | ORAL | Status: DC | PRN
  Administered 2023-05-26: 50 mg via ORAL
  Filled 2023-05-26: qty 1

## 2023-05-26 MED ORDER — HYDROCORTISONE ACETATE 25 MG RE SUPP
25.0000 mg | Freq: Two times a day (BID) | RECTAL | Status: DC
  Administered 2023-05-26: 25 mg via RECTAL
  Filled 2023-05-26 (×3): qty 1

## 2023-05-26 MED ORDER — ALPRAZOLAM 0.25 MG PO TABS
0.2500 mg | ORAL_TABLET | Freq: Every day | ORAL | Status: DC | PRN
  Filled 2023-05-26: qty 1

## 2023-05-26 NOTE — Progress Notes (Signed)
 Progress Note   Patient: Tammy Gould HYQ:[id number] DOB: 07-10-1950 DOA: 05/25/2023     0 DOS: the patient was seen and examined on 05/26/2023   Brief hospital course: 73 y.o. female with medical history significant for mild cognitive impairment, anxiety and depression, HLD, HTN, DDD, vitamin B12 deficiency, multinodular goiter, prediabetes and OSA who presented to the ED for evaluation of rectal bleeding. Patient reports that this morning, she went to the bathroom and was unable to have any bowel movement but noticed bright red blood in the toilet.  She did have 3 small subsequent bowel movements with blood mixed with diarrhea. She denies any history of GI bleed and not sure if she has had colonoscopy recently. She denies any anal pain, melena, hematuria, hematemesis, abdominal pain, nausea, vomiting, dizziness, chest pain, shortness of breath or headache.   ED Course: Initial vitals showed patient afebrile, normotensive with SpO2 of 98% on room air.  Initial labs significant for WBC 5.7, Hgb 13.1, PT/INR 13.6/1.0, normal kidney function and LFTs.  CT A/P does not show any active GI bleed or acute intraabdominal or pelvic abnormality.  TRH was consulted for admission.  5/24.  This morning his hemoglobin dropped down to 11.7 but repeat hemoglobin came up to 12.2.  Seen by gastroenterology and colonoscopy set up for tomorrow.  Assessment and Plan: * Lower GI bleed Hemoglobin did drop down to 11.7 but repeat hemoglobin up to 12.2.  Seen by gastroenterology and posted for colonoscopy tomorrow.  Prescribed Anusol suppositories for hemorrhoids.  Could also be diverticular bleed.  Essential hypertension Held amlodipine this morning with relative hypotension.  Vitamin B12 deficiency On oral supplementation  Mild cognitive impairment On Aricept  Generalized anxiety disorder On Cymbalta  and as needed Xanax   Multinodular goiter Outpatient follow-up        Subjective: Patient admitted with  rectal bleeding.  This morning he had a bowel movement and we did not see any blood.  Stool had a black appearance initially but then was brown after that.  Physical Exam: Vitals:   05/26/23 0730 05/26/23 0803 05/26/23 0806 05/26/23 0811  BP: 121/77 129/85 (!) 129/100 (!) 124/94  Pulse: 63 66 74 84  Resp: 16     Temp: 97.8 F (36.6 C)     TempSrc: Oral     SpO2: 99% 99% 99% 99%  Weight:      Height:       Physical Exam HENT:     Head: Normocephalic.  Eyes:     General: Lids are normal.     Conjunctiva/sclera: Conjunctivae normal.  Cardiovascular:     Rate and Rhythm: Normal rate and regular rhythm.     Heart sounds: Normal heart sounds, S1 normal and S2 normal.  Pulmonary:     Breath sounds: No decreased breath sounds, wheezing, rhonchi or rales.  Abdominal:     Palpations: Abdomen is soft.     Tenderness: There is no abdominal tenderness.  Musculoskeletal:     Right lower leg: No swelling.     Left lower leg: No swelling.  Skin:    General: Skin is warm.     Findings: No rash.  Neurological:     Mental Status: She is alert.     Data Reviewed: This morning's hemoglobin dipped down to 11.7.  Repeat hemoglobin 12.2.  Family Communication: Updated patient's daughter  Disposition: Status is: Observation Colonoscopy for tomorrow  Planned Discharge Destination: Home    Time spent: 28 minutes Case discussed with gastroenterology  Author: Verla Glaze, MD 05/26/2023 1:46 PM  For on call review [email or web address].

## 2023-05-26 NOTE — Hospital Course (Signed)
 73 y.o. female with medical history significant for mild cognitive impairment, anxiety and depression, HLD, HTN, DDD, vitamin B12 deficiency, multinodular goiter, prediabetes and OSA who presented to the ED for evaluation of rectal bleeding. Patient reports that this morning, she went to the bathroom and was unable to have any bowel movement but noticed bright red blood in the toilet.  She did have 3 small subsequent bowel movements with blood mixed with diarrhea. She denies any history of GI bleed and not sure if she has had colonoscopy recently. She denies any anal pain, melena, hematuria, hematemesis, abdominal pain, nausea, vomiting, dizziness, chest pain, shortness of breath or headache.   ED Course: Initial vitals showed patient afebrile, normotensive with SpO2 of 98% on room air.  Initial labs significant for WBC 5.7, Hgb 13.1, PT/INR 13.6/1.0, normal kidney function and LFTs.  CT A/P does not show any active GI bleed or acute intraabdominal or pelvic abnormality.  TRH was consulted for admission.  5/24.  This morning his hemoglobin dropped down to 11.7 but repeat hemoglobin came up to 12.2.  Seen by gastroenterology and colonoscopy set up for tomorrow. 5/25.  Colonoscopy did not see any acute bleeding 1 polyp was removed and clipped.  Patient stable for discharge home.  Had external hemorrhoids.  Hemoglobin 11.1.

## 2023-05-26 NOTE — Plan of Care (Signed)

## 2023-05-26 NOTE — Assessment & Plan Note (Signed)
On Aricept 

## 2023-05-26 NOTE — Assessment & Plan Note (Signed)
 Outpatient follow up.

## 2023-05-26 NOTE — Assessment & Plan Note (Addendum)
 Held amlodipine this morning with relative hypotension.

## 2023-05-26 NOTE — Assessment & Plan Note (Signed)
On oral supplementation

## 2023-05-26 NOTE — Consult Note (Signed)
 Tammy Oz, MD [address]  [address]  [place], [place] [address]  Main: [phone]  Fax: [phone] Pager: [phone]   Consultation  Referring Provider:     No ref. provider found Primary Care Physician:  Sari Cunning, MD Primary Gastroenterologist: Para Bold         Reason for Consultation: Painless rectal bleeding  Date of Admission:  05/25/2023 Date of Consultation:  05/26/2023         HPI:   Tammy Gould is a 73 y.o. female with history of hypertension, hyperlipidemia, multinodular goiter, OSA, prediabetes presented yesterday to ER secondary to bright red blood per rectum.  She denies any constipation.  She reports bright red blood on wiping and the amount of blood is mild to moderate only not associated with blood clots.  She had small bowel movement and noticed scant amount of blood on wiping only since admission.  She is on clear liquid diet.  Her hemoglobin on admission was 13.1, dropped to 12.2 today.  Normal platelets, normal MCV.  Normal BUN/creatinine, normal LFTs.  Normal iron panel, serum ferritin less than 50, B12 low normal   NSAIDs: None  Antiplts/Anticoagulants/Anti thrombotics: None  GI Procedures: Colonoscopy by Dr. Felicita Horns in 2020, report not available  Past Medical History:  Diagnosis Date   Arthritis    hands   Cervical disc disease 05/05/2013   Generalized anxiety disorder    Hearing loss    Has, does not wear.   History of COVID-19 02/2020   History of hepatitis B 1972   hep b antigen negative 07/20/2016   History of skin cancer 2019   Hyperglycemia 11/11/2013   Hyperlipidemia, mixed 05/24/2015   Hypertension    Lumbar disc disease 05/05/2013   Major depressive disorder 05/24/2015   OSA on CPAP 05/12/2014   12/18 study AHI 6, RDI 23   Ovarian mass, left 07/19/2016   PONV (postoperative nausea and vomiting)    Pre-diabetes    Recurrent incisional hernia 12/31/2020   Slow transit constipation 05/16/2018   Vertigo  01/2017   off and on for about 6 months.  Relieved by [facility].   Vitamin B12 deficiency 11/30/2016   149, 2018 B12 injections    Past Surgical History:  Procedure Laterality Date   ABDOMINAL HYSTERECTOMY Bilateral 07/21/2016   Procedure: HYSTERECTOMY ABDOMINAL;  Surgeon: Ward, Margarie Shay, MD;  Location: [facility];  Service: Gynecology;  Laterality: Bilateral;   ABDOMINAL HYSTERECTOMY     APPENDECTOMY     BREAST EXCISIONAL BIOPSY Bilateral [id number]   Multiple biopsies (4), negative   CATARACT EXTRACTION W/PHACO Right 01/06/2019   Procedure: CATARACT EXTRACTION PHACO AND INTRAOCULAR LENS PLACEMENT (IOC) RIGHT DIABETIC SYMFONY LENS  8.03  52.8;  Surgeon: Rosa College, MD;  Location: [facility];  Service: Ophthalmology;  Laterality: Right;  sleep apnea   CATARACT EXTRACTION W/PHACO Left 01/27/2019   Procedure: CATARACT EXTRACTION PHACO AND INTRAOCULAR LENS PLACEMENT (IOC) LEFT DIABETIC SYMFONY LENS 3.39  00:35.9;  Surgeon: Rosa College, MD;  Location: [facility];  Service: Ophthalmology;  Laterality: Left;   INCISIONAL HERNIA REPAIR N/A 12/31/2020   Procedure: HERNIA REPAIR INCISIONAL;  Surgeon: Eldred Grego, MD;  Location: [facility];  Service: General;  Laterality: N/A;   INSERTION OF MESH  12/31/2020   Procedure: INSERTION OF MESH;  Surgeon: Eldred Grego, MD;  Location: [facility];  Service: General;;   SALPINGOOPHORECTOMY Bilateral 07/21/2016   Procedure: SALPINGO OOPHORECTOMY;  Surgeon: Laverle Postin  C, MD;  Location: [facility];  Service: Gynecology;  Laterality: Bilateral;   TONSILLECTOMY     TOTAL HIP ARTHROPLASTY Right 2015   VENTRAL HERNIA REPAIR N/A 12/27/2017   Procedure: LAPAROSCOPIC VENTRAL HERNIA REPAIR WITH MESH;  Surgeon: Conrado Delay, DO;  Location: [facility];  Service: General;  Laterality: N/A;   WRIST SURGERY Right 2012   plate and screw in wrist     Current Facility-Administered Medications:    acetaminophen  (TYLENOL )  tablet 650 mg, 650 mg, Oral, Q6H PRN **OR** acetaminophen  (TYLENOL ) suppository 650 mg, 650 mg, Rectal, Q6H PRN, Yvonne Hering, Annette Killings, MD   ALPRAZolam  (XANAX ) tablet 0.25 mg, 0.25 mg, Oral, Daily PRN, Clelia Current, Richard, MD   cyanocobalamin  (VITAMIN B12) tablet 1,000 mcg, 1,000 mcg, Oral, Daily, Amponsah, Prosper M, MD, 1,000 mcg at 05/26/23 0937   donepezil (ARICEPT) tablet 5 mg, 5 mg, Oral, QHS, Amponsah, Annette Killings, MD   DULoxetine  (CYMBALTA ) DR capsule 30 mg, 30 mg, Oral, Daily, Clelia Current, Richard, MD, 30 mg at 05/26/23 8295   hydrocortisone (ANUSOL-HC) suppository 25 mg, 25 mg, Rectal, BID, Clelia Current, Richard, MD, 25 mg at 05/26/23 6213   meclizine  (ANTIVERT ) tablet 12.5 mg, 12.5 mg, Oral, TID PRN, Vita Grip, MD   ondansetron  (ZOFRAN ) tablet 4 mg, 4 mg, Oral, Q6H PRN **OR** ondansetron  (ZOFRAN ) injection 4 mg, 4 mg, Intravenous, Q6H PRN, Yvonne Hering, Annette Killings, MD   pantoprazole  (PROTONIX ) EC tablet 40 mg, 40 mg, Oral, Daily, Wieting, Richard, MD, 40 mg at 05/26/23 0937   polyethylene glycol powder (GLYCOLAX /MIRALAX ) container 238 g, 238 g, Oral, Once, Krislynn Gronau Reddy, MD   senna-docusate (Senokot-S) tablet 1 tablet, 1 tablet, Oral, QHS PRN, Vita Grip, MD   traMADol Marionette Sick) tablet 50 mg, 50 mg, Oral, Q6H PRN, Verla Glaze, MD, 50 mg at 05/26/23 1028   Family History  Problem Relation Age of Onset   Alzheimer's disease Mother    Breast cancer Paternal Grandmother 36   Dementia Other        uncle; Lewy body disease   Alzheimer's disease Maternal Aunt        several family members   Alzheimer's disease Maternal Uncle        several family members     Social History   Tobacco Use   Smoking status: Former    Current packs/day: 0.00    Types: Cigarettes    Quit date: 01/02/1986    Years since quitting: 37.4   Smokeless tobacco: Never  Vaping Use   Vaping status: Never Used  Substance Use Topics   Alcohol use: Not Currently    Comment: rarely   Drug use: No     Allergies as of 05/25/2023 - Review Complete 05/25/2023  Allergen Reaction Noted   Penicillins Hives 06/01/2015    Review of Systems:    All systems reviewed and negative except where noted in HPI.   Physical Exam:  Vital signs in last 24 hours: Temp:  [97.8 F (36.6 C)-98.2 F (36.8 C)] 97.8 F (36.6 C) (05/24 0730) Pulse Rate:  [63-84] 84 (05/24 0811) Resp:  [14-18] 16 (05/24 0730) BP: (98-139)/(67-100) 124/94 (05/24 0811) SpO2:  [95 %-100 %] 99 % (05/24 0811) Last BM Date : 05/26/23 General:   Pleasant, cooperative in NAD Head:  Normocephalic and atraumatic. Eyes:   No icterus.   Conjunctiva pink. PERRLA. Ears:  Normal auditory acuity. Neck:  Supple; no masses or thyroidomegaly Lungs: Respirations even and unlabored. Lungs clear to auscultation bilaterally.   No wheezes, crackles,  or rhonchi.  Heart:  Regular rate and rhythm;  Without murmur, clicks, rubs or gallops Abdomen:  Soft, nondistended, nontender. Normal bowel sounds. No appreciable masses or hepatomegaly.  No rebound or guarding.  Rectal:  Not performed. Msk:  Symmetrical without gross deformities.  Strength normal Extremities:  Without edema, cyanosis or clubbing. Neurologic:  Alert and oriented x3;  grossly normal neurologically. Skin:  Intact without significant lesions or rashes. Psych:  Alert and cooperative. Normal affect.  LAB RESULTS:    Latest Ref Rng & Units 05/26/2023   11:57 AM 05/26/2023    3:41 AM 05/25/2023    8:50 PM  CBC  Hemoglobin 12.0 - 15.0 g/dL 09.8  11.9  14.7   Hematocrit 36.0 - 46.0 %  33.9  37.3     BMET    Latest Ref Rng & Units 05/26/2023    3:41 AM 05/25/2023   11:39 AM 04/14/2023   10:47 AM  BMP  Glucose 70 - 99 mg/dL 829  562  130   BUN 8 - 23 mg/dL 17  22  18    Creatinine 0.44 - 1.00 mg/dL 8.65  7.84  6.96   Sodium 135 - 145 mmol/L 141  139  139   Potassium 3.5 - 5.1 mmol/L 3.9  3.8  3.7   Chloride 98 - 111 mmol/L 109  105  106   CO2 22 - 32 mmol/L 25  24  25     Calcium 8.9 - 10.3 mg/dL 9.0  9.3  9.4     LFT    Latest Ref Rng & Units 05/25/2023   11:39 AM 07/21/2016    6:25 AM 07/20/2016    5:22 AM  Hepatic Function  Total Protein 6.5 - 8.1 g/dL 6.8  5.7  6.5   Albumin 3.5 - 5.0 g/dL 4.0  3.5  3.9   AST 15 - 41 U/L 20  17  18    ALT 0 - 44 U/L 24  13  14    Alk Phosphatase 38 - 126 U/L 47  43  50   Total Bilirubin 0.0 - 1.2 mg/dL 0.6  0.9  0.8      STUDIES: CT ABDOMEN PELVIS W CONTRAST Result Date: 05/25/2023 CLINICAL DATA:  Diarrhea bright red bowel movement EXAM: CT ABDOMEN AND PELVIS WITH CONTRAST TECHNIQUE: Multidetector CT imaging of the abdomen and pelvis was performed using the standard protocol following bolus administration of intravenous contrast. RADIATION DOSE REDUCTION: This exam was performed according to the departmental dose-optimization program which includes automated exposure control, adjustment of the mA and/or kV according to patient size and/or use of iterative reconstruction technique. CONTRAST:  OMNIPAQUE IOHEXOL 300 MG/ML  SOLN COMPARISON:  CT 12/21/2020, 07/19/2016 FINDINGS: Lower chest: Lung bases demonstrate no acute airspace disease. Hepatobiliary: Stable low-density lesions in the liver consistent with cysts. Small gallstone. No biliary dilatation Pancreas: Unremarkable. No pancreatic ductal dilatation or surrounding inflammatory changes. Spleen: Normal in size without focal abnormality. Adrenals/Urinary Tract: Adrenal glands are normal. 5 mm left kidney stone. Mild to moderate hydronephrosis of the left kidney without hydroureter, this is stable as compared with 2022 exam. Urinary bladder is unremarkable Stomach/Bowel: Stomach nonenlarged. No dilated small bowel. Small hiatal hernia. Diverticular disease of the colon without acute wall thickening Vascular/Lymphatic: Aortic atherosclerosis. No enlarged abdominal or pelvic lymph nodes. Reproductive: Status post hysterectomy. No adnexal masses. Other: Negative for pelvic  effusion or free air. Fat hernias in the inguinal regions. Status post ventral hernia repair Musculoskeletal: No acute or suspicious  osseous abnormality. IMPRESSION: 1. No CT evidence for acute intra-abdominal or pelvic abnormality. 2. 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 3. 5 mm left kidney stone. 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 4. Small gallstone 5. Aortic atherosclerosis. Aortic Atherosclerosis (ICD10-I70.0). Electronically Signed   By: Esmeralda Hedge M.D.   On: 05/25/2023 17:28      Impression / Plan:   LEGNA MAUSOLF is a 73 y.o. female with history of hypertension, hyperlipidemia is admitted with painless bright red blood per rectum, hemodynamically insignificant lower GI bleed, likely hemorrhoidal.  Hemoglobin is normal.  Discussed with patient regarding outpatient versus inpatient colonoscopy and she preferred to undergo while inpatient tomorrow Start clear liquid diet MiraLAX  bowel prep ordered Plan for colonoscopy tomorrow  I have discussed alternative options, risks & benefits,  which include, but are not limited to, bleeding, infection, perforation,respiratory complication & drug reaction.  The patient agrees with this plan & written consent will be obtained.     Thank you for involving me in the care of this patient.      LOS: 0 days   Ellis Guys, MD  05/26/2023, 2:10 PM    Note: This dictation was prepared with Dragon dictation along with smaller phrase technology. Any transcriptional errors that result from this process are unintentional.

## 2023-05-26 NOTE — Assessment & Plan Note (Signed)
 Hemoglobin did drop down to 11.7 but repeat hemoglobin up to 12.2.  Seen by gastroenterology and posted for colonoscopy tomorrow.  Prescribed Anusol suppositories for hemorrhoids.  Could also be diverticular bleed.

## 2023-05-26 NOTE — Assessment & Plan Note (Signed)
 On as needed Xanax 

## 2023-05-27 ENCOUNTER — Encounter
Admission: EM | Disposition: A | Discharge status: Home / Self Care | Payer: Self-pay | Source: Home / Self Care | Attending: Emergency Medicine

## 2023-05-27 ENCOUNTER — Observation Stay: Admitting: Certified Registered Nurse Anesthetist

## 2023-05-27 ENCOUNTER — Encounter: Payer: Self-pay | Admitting: Student

## 2023-05-27 DIAGNOSIS — I10 Essential (primary) hypertension: Secondary | ICD-10-CM | POA: Diagnosis not present

## 2023-05-27 DIAGNOSIS — K644 Residual hemorrhoidal skin tags: Secondary | ICD-10-CM | POA: Diagnosis not present

## 2023-05-27 DIAGNOSIS — K5730 Diverticulosis of large intestine without perforation or abscess without bleeding: Secondary | ICD-10-CM

## 2023-05-27 DIAGNOSIS — K922 Gastrointestinal hemorrhage, unspecified: Secondary | ICD-10-CM | POA: Diagnosis not present

## 2023-05-27 DIAGNOSIS — G3184 Mild cognitive impairment, so stated: Secondary | ICD-10-CM | POA: Diagnosis not present

## 2023-05-27 DIAGNOSIS — D122 Benign neoplasm of ascending colon: Secondary | ICD-10-CM | POA: Diagnosis not present

## 2023-05-27 DIAGNOSIS — E538 Deficiency of other specified B group vitamins: Secondary | ICD-10-CM | POA: Diagnosis not present

## 2023-05-27 HISTORY — PX: HEMOSTASIS CLIP PLACEMENT: SHX6857

## 2023-05-27 HISTORY — PX: POLYPECTOMY: SHX149

## 2023-05-27 HISTORY — PX: COLONOSCOPY: SHX5424

## 2023-05-27 LAB — HEMOGLOBIN: Hemoglobin: 11.1 g/dL — ABNORMAL LOW (ref 12.0–15.0)

## 2023-05-27 SURGERY — COLONOSCOPY
Anesthesia: General

## 2023-05-27 MED ORDER — PANTOPRAZOLE SODIUM 40 MG PO TBEC: 40.0000 mg | DELAYED_RELEASE_TABLET | Freq: Every day | ORAL | 0 refills | Status: DC

## 2023-05-27 MED ORDER — PROPOFOL 500 MG/50ML IV EMUL
INTRAVENOUS | Status: DC | PRN
  Administered 2023-05-27: 150 ug/kg/min via INTRAVENOUS

## 2023-05-27 MED ORDER — PROPOFOL 10 MG/ML IV BOLUS
INTRAVENOUS | Status: DC | PRN
  Administered 2023-05-27: 60 mg via INTRAVENOUS

## 2023-05-27 MED ORDER — PHENYLEPHRINE 80 MCG/ML (10ML) SYRINGE FOR IV PUSH (FOR BLOOD PRESSURE SUPPORT)
PREFILLED_SYRINGE | INTRAVENOUS | Status: AC
  Filled 2023-05-27: qty 10

## 2023-05-27 MED ORDER — SODIUM CHLORIDE 0.9 % IV SOLN: INTRAVENOUS | Status: DC

## 2023-05-27 MED ORDER — HYDROCORTISONE (PERIANAL) 2.5 % EX CREA
TOPICAL_CREAM | Freq: Two times a day (BID) | CUTANEOUS | Status: DC
  Filled 2023-05-27: qty 28.35

## 2023-05-27 MED ORDER — LIDOCAINE HCL (CARDIAC) PF 100 MG/5ML IV SOSY
PREFILLED_SYRINGE | INTRAVENOUS | Status: DC | PRN
  Administered 2023-05-27: 50 mg via INTRAVENOUS

## 2023-05-27 MED ORDER — PHENYLEPHRINE 80 MCG/ML (10ML) SYRINGE FOR IV PUSH (FOR BLOOD PRESSURE SUPPORT)
PREFILLED_SYRINGE | INTRAVENOUS | Status: DC | PRN
  Administered 2023-05-27: 80 ug via INTRAVENOUS

## 2023-05-27 MED ORDER — HYDROCORTISONE (PERIANAL) 2.5 % EX CREA: TOPICAL_CREAM | Freq: Two times a day (BID) | CUTANEOUS | 0 refills | Status: AC

## 2023-05-27 NOTE — Plan of Care (Signed)

## 2023-05-27 NOTE — Progress Notes (Signed)
 Reviewed discharge note with patient. Patient acknowledged understanding. Patient discharged with personal belongings. Patient wheeled out by staff. No distress noted in patient. Patient transported home via family vehicle.

## 2023-05-27 NOTE — Op Note (Signed)
 [facility] Gastroenterology Patient Name: Tammy Gould Procedure Date: 05/27/2023 10:26 AM MRN: [id number] Account #: [id number] Date of Birth: 04-01-1950 Admit Type: Inpatient Age: 73 Room: [facility] ENDO ROOM 4 Gender: Female Note Status: Finalized Instrument Name: Colonoscope [id number] Procedure:             Colonoscopy Indications:           Last colonoscopy: May 2010, Last colonoscopy 5 years                         ago, Rectal bleeding Providers:             Selena Daily MD, MD Referring MD:          Sari Cunning, MD (Referring MD) Medicines:             General Anesthesia Complications:         No immediate complications. Estimated blood loss: None. Procedure:             Pre-Anesthesia Assessment:                        - Prior to the procedure, a History and Physical was                         performed, and patient medications and allergies were                         reviewed. The patient is competent. The risks and                         benefits of the procedure and the sedation options and                         risks were discussed with the patient. All questions                         were answered and informed consent was obtained.                         Patient identification and proposed procedure were                         verified by the physician, the nurse, the                         anesthesiologist, the anesthetist and the technician                         in the pre-procedure area in the procedure room in the                         endoscopy suite. Mental Status Examination: alert and                         oriented. Airway Examination: normal oropharyngeal                         airway and neck mobility. Respiratory Examination:  clear to auscultation. CV Examination: normal.                         Prophylactic Antibiotics: The patient does not require                         prophylactic  antibiotics. Prior Anticoagulants: The                         patient has taken no anticoagulant or antiplatelet                         agents. ASA Grade Assessment: II - A patient with mild                         systemic disease. After reviewing the risks and                         benefits, the patient was deemed in satisfactory                         condition to undergo the procedure. The anesthesia                         plan was to use general anesthesia. Immediately prior                         to administration of medications, the patient was                         re-assessed for adequacy to receive sedatives. The                         heart rate, respiratory rate, oxygen saturations,                         blood pressure, adequacy of pulmonary ventilation, and                         response to care were monitored throughout the                         procedure. The physical status of the patient was                         re-assessed after the procedure.                        After obtaining informed consent, the colonoscope was                         passed under direct vision. Throughout the procedure,                         the patient's blood pressure, pulse, and oxygen                         saturations were monitored continuously. The  Colonoscope was introduced through the anus and                         advanced to the the cecum, identified by appendiceal                         orifice and ileocecal valve. The colonoscopy was                         performed without difficulty. The patient tolerated                         the procedure well. The quality of the bowel                         preparation was fair. The ileocecal valve, appendiceal                         orifice, and rectum were photographed. Findings:      The perianal and digital rectal examinations were normal. Pertinent       negatives include normal  sphincter tone and no palpable rectal lesions.      A 6 mm polyp was found in the proximal ascending colon. The polyp was       sessile. The polyp was removed with a cold snare. Resection and       retrieval were complete. Estimated blood loss was minimal. To prevent       bleeding after the polypectomy, one hemostatic clip was successfully       placed (MR safe). Clip manufacturer: [employer]. There was no       bleeding at the end of the procedure.      Multiple medium-mouthed diverticula were found in the entire colon.      Non-bleeding external hemorrhoids were found during retroflexion. The       hemorrhoids were small. Impression:            - Preparation of the colon was fair.                        - One 6 mm polyp in the proximal ascending colon,                         removed with a cold snare. Resected and retrieved.                         Clip manufacturer: [employer]. Clip (MR safe)                         was placed.                        - Diverticulosis in the entire examined colon.                        - Non-bleeding external hemorrhoids. Recommendation:        - Return patient to hospital ward for possible                         discharge same day.                        -  Resume previous diet today. Procedure Code(s):     --- Professional ---                        [phone], Colonoscopy, flexible; with removal of                         tumor(s), polyp(s), or other lesion(s) by snare                         technique Diagnosis Code(s):     --- Professional ---                        K64.4, Residual hemorrhoidal skin tags                        D12.2, Benign neoplasm of ascending colon                        K62.5, Hemorrhage of anus and rectum                        K57.30, Diverticulosis of large intestine without                         perforation or abscess without bleeding CPT copyright 2022 [employer]. All rights reserved. The  codes documented in this report are preliminary and upon coder review may  be revised to meet current compliance requirements. Dr. Evia Hof Selena Daily MD, MD 05/27/2023 10:54:13 AM This report has been signed electronically. Number of Addenda: 0 Note Initiated On: 05/27/2023 10:26 AM Scope Withdrawal Time: 0 hours 7 minutes 42 seconds  Total Procedure Duration: 0 hours 10 minutes 33 seconds  Estimated Blood Loss:  Estimated blood loss: none.      [facility]

## 2023-05-27 NOTE — Discharge Summary (Signed)
 Physician Discharge Summary   Patient: Tammy Gould MRN: [id number] DOB: 04-Jun-1950  Admit date:     05/25/2023  Discharge date: 05/27/23  Discharge Physician: Verla Glaze   PCP: Sari Cunning, MD   Recommendations at discharge:   Follow-up PCP 5 days  Discharge Diagnoses: Principal Problem:   Lower GI bleed Active Problems:   Essential hypertension   Vitamin B12 deficiency   Mild cognitive impairment   Generalized anxiety disorder   Multinodular goiter   Painless rectal bleeding   External hemorrhoids    Hospital Course: 73 y.o. female with medical history significant for mild cognitive impairment, anxiety and depression, HLD, HTN, DDD, vitamin B12 deficiency, multinodular goiter, prediabetes and OSA who presented to the ED for evaluation of rectal bleeding. Patient reports that this morning, she went to the bathroom and was unable to have any bowel movement but noticed bright red blood in the toilet.  She did have 3 small subsequent bowel movements with blood mixed with diarrhea. She denies any history of GI bleed and not sure if she has had colonoscopy recently. She denies any anal pain, melena, hematuria, hematemesis, abdominal pain, nausea, vomiting, dizziness, chest pain, shortness of breath or headache.   ED Course: Initial vitals showed patient afebrile, normotensive with SpO2 of 98% on room air.  Initial labs significant for WBC 5.7, Hgb 13.1, PT/INR 13.6/1.0, normal kidney function and LFTs.  CT A/P does not show any active GI bleed or acute intraabdominal or pelvic abnormality.  [facility] was consulted for admission.  5/24.  This morning his hemoglobin dropped down to 11.7 but repeat hemoglobin came up to 12.2.  Seen by gastroenterology and colonoscopy set up for tomorrow. 5/25.  Colonoscopy did not see any acute bleeding 1 polyp was removed and clipped.  Patient stable for discharge home.  Had external hemorrhoids.  Hemoglobin 11.1.  Assessment and Plan: * Lower GI  bleed Hemoglobin 11.1 upon discharge.  Recommend follow-up hemoglobin as outpatient.  Colonoscopy did not show any acute bleeding.  1 polyp removed and clipped.  Bleeding could be from diverticular bleed versus hemorrhoidal bleed.  Anusol cream prescribed for external hemorrhoids.  Essential hypertension Held amlodipine this morning with relative hypotension.  Will hold upon discharge.  Vitamin B12 deficiency On oral supplementation  Mild cognitive impairment On Aricept  Generalized anxiety disorder On as needed Xanax   Multinodular goiter Outpatient follow-up  External hemorrhoids Anusol cream prescribed         Consultants: Gastroenterology Procedures performed: Colonoscopy Disposition: Home Diet recommendation:  Regular diet DISCHARGE MEDICATION: Allergies as of 05/27/2023       Reactions   Vitamin E Dermatitis   Penicillins Hives   Has patient had a PCN reaction causing immediate rash, facial/tongue/throat swelling, SOB or lightheadedness with hypotension: Yes Has patient had a PCN reaction causing severe rash involving mucus membranes or skin necrosis: No Has patient had a PCN reaction that required hospitalization: No Has patient had a PCN reaction occurring within the last 10 years: No If all of the above answers are "NO", then may proceed with Cephalosporin use.        Medication List     STOP taking these medications    Advil  Dual Action 125-250 MG Tabs Generic drug: Ibuprofen -Acetaminophen    amLODipine 5 MG tablet Commonly known as: NORVASC   DULoxetine  30 MG capsule Commonly known as: CYMBALTA    HYDROcodone -acetaminophen  5-325 MG tablet Commonly known as: NORCO/VICODIN   ibuprofen  200 MG tablet Commonly known as: ADVIL   traMADol 50 MG tablet Commonly known as: ULTRAM       TAKE these medications    alendronate 70 MG tablet Commonly known as: FOSAMAX Take 70 mg by mouth once a week.   ALPRAZolam  0.25 MG tablet Commonly known as:  XANAX  Take 0.25 mg by mouth daily as needed for anxiety.   baclofen 10 MG tablet Commonly known as: LIORESAL Take 1 tablet by mouth 3 (three) times daily.   cholecalciferol 25 MCG (1000 UNIT) tablet Commonly known as: VITAMIN D3 Take 1,000 Units by mouth daily.   clindamycin  150 MG capsule Commonly known as: CLEOCIN  Take 600 mg by mouth See admin instructions. Before dental procedures   cyanocobalamin  1000 MCG tablet Commonly known as: VITAMIN B12 Take 1,000 mcg by mouth daily.   donepezil 5 MG tablet Commonly known as: ARICEPT Take 5 mg by mouth at bedtime.   hydrocortisone 2.5 % rectal cream Commonly known as: ANUSOL-HC Place rectally 2 (two) times daily.   meclizine  12.5 MG tablet Commonly known as: ANTIVERT  Take 1 tablet (12.5 mg total) by mouth 3 (three) times daily as needed for dizziness.   ondansetron  4 MG disintegrating tablet Commonly known as: ZOFRAN -ODT Take 1 tablet (4 mg total) by mouth every 8 (eight) hours as needed for nausea or vomiting.        Follow-up Information     Sari Cunning, MD Follow up in 5 day(s).   Specialty: Internal Medicine Contact information: [address] [facility] [place] [facility] [place] [address] [phone]                Discharge Exam: Tammy Gould Weights   05/25/23 1136  Weight: 78.5 kg   Physical Exam HENT:     Head: Normocephalic.  Eyes:     General: Lids are normal.     Conjunctiva/sclera: Conjunctivae normal.  Cardiovascular:     Rate and Rhythm: Normal rate and regular rhythm.     Heart sounds: Normal heart sounds, S1 normal and S2 normal.  Pulmonary:     Breath sounds: No decreased breath sounds, wheezing, rhonchi or rales.  Abdominal:     Palpations: Abdomen is soft.     Tenderness: There is no abdominal tenderness.  Musculoskeletal:     Right lower leg: No swelling.     Left lower leg: No swelling.  Skin:    General: Skin is warm.     Findings: No rash.  Neurological:      Mental Status: She is alert.      Condition at discharge: stable  The results of significant diagnostics from this hospitalization (including imaging, microbiology, ancillary and laboratory) are listed below for reference.   Imaging Studies: CT ABDOMEN PELVIS W CONTRAST Result Date: 05/25/2023 CLINICAL DATA:  Diarrhea bright red bowel movement EXAM: CT ABDOMEN AND PELVIS WITH CONTRAST TECHNIQUE: Multidetector CT imaging of the abdomen and pelvis was performed using the standard protocol following bolus administration of intravenous contrast. RADIATION DOSE REDUCTION: This exam was performed according to the departmental dose-optimization program which includes automated exposure control, adjustment of the mA and/or kV according to patient size and/or use of iterative reconstruction technique. CONTRAST:  OMNIPAQUE IOHEXOL 300 MG/ML  SOLN COMPARISON:  CT 12/21/2020, 07/19/2016 FINDINGS: Lower chest: Lung bases demonstrate no acute airspace disease. Hepatobiliary: Stable low-density lesions in the liver consistent with cysts. Small gallstone. No biliary dilatation Pancreas: Unremarkable. No pancreatic ductal dilatation or surrounding inflammatory changes. Spleen: Normal in size without focal abnormality. Adrenals/Urinary Tract: Adrenal  glands are normal. 5 mm left kidney stone. Mild to moderate hydronephrosis of the left kidney without hydroureter, this is stable as compared with 2022 exam. Urinary bladder is unremarkable Stomach/Bowel: Stomach nonenlarged. No dilated small bowel. Small hiatal hernia. Diverticular disease of the colon without acute wall thickening Vascular/Lymphatic: Aortic atherosclerosis. No enlarged abdominal or pelvic lymph nodes. Reproductive: Status post hysterectomy. No adnexal masses. Other: Negative for pelvic effusion or free air. Fat hernias in the inguinal regions. Status post ventral hernia repair Musculoskeletal: No acute or suspicious osseous abnormality. IMPRESSION:  1. No CT evidence for acute intra-abdominal or pelvic abnormality. 2. 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 3. 5 mm left kidney stone. 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 4. Small gallstone 5. Aortic atherosclerosis. Aortic Atherosclerosis (ICD10-I70.0). Electronically Signed   By: Esmeralda Hedge M.D.   On: 05/25/2023 17:28    Microbiology: Results for orders placed or performed during the hospital encounter of 12/29/20  SARS CORONAVIRUS 2 (TAT 6-24 HRS) Nasopharyngeal Nasopharyngeal Swab     Status: None   Collection Time: 12/29/20 10:12 AM   Specimen: Nasopharyngeal Swab  Result Value Ref Range Status   SARS Coronavirus 2 NEGATIVE NEGATIVE Final    Comment: (NOTE) SARS-CoV-2 target nucleic acids are NOT DETECTED.  The SARS-CoV-2 RNA is generally detectable in upper and lower respiratory specimens during the acute phase of infection. Negative results do not preclude SARS-CoV-2 infection, do not rule out co-infections with other pathogens, and should not be used as the sole basis for treatment or other patient management decisions. Negative results must be combined with clinical observations, patient history, and epidemiological information. The expected result is Negative.  Fact Sheet for Patients: HairSlick.no  Fact Sheet for Healthcare Providers: quierodirigir.com  This test is not yet approved or cleared by the [place]  FDA and  has been authorized for detection and/or diagnosis of SARS-CoV-2 by FDA under an Emergency Use Authorization (EUA). This EUA will remain  in effect (meaning this test can be used) for the duration of the COVID-19 declaration under Se ction 564(b)(1) of the Act, 21 U.S.C. section 360bbb-3(b)(1), unless the authorization is terminated or revoked  sooner.  Performed at [facility], [address] [place], [place] [address]     Labs: CBC: Recent Labs  Lab 05/25/23 1139 05/25/23 2050 05/26/23 0341 05/26/23 1157 05/27/23 0755  WBC 5.7  --   --   --   --   HGB 13.1 13.0 11.7* 12.2 11.1*  HCT 40.0 37.3 33.9*  --   --   MCV 96.9  --   --   --   --   PLT 241  --   --   --   --    Basic Metabolic Panel: Recent Labs  Lab 05/25/23 1139 05/26/23 0341  NA 139 141  K 3.8 3.9  CL 105 109  CO2 24 25  GLUCOSE 158* 110*  BUN 22 17  CREATININE 0.70 0.53  CALCIUM 9.3 9.0   Liver Function Tests: Recent Labs  Lab 05/25/23 1139  AST 20  ALT 24  ALKPHOS 47  BILITOT 0.6  PROT 6.8  ALBUMIN 4.0   CBG: No results for input(s): "GLUCAP" in the last 168 hours.  Discharge time spent: greater than 30 minutes.  Signed: Verla Glaze, MD [facility] 05/27/2023

## 2023-05-27 NOTE — Anesthesia Preprocedure Evaluation (Signed)
 Anesthesia Evaluation  Patient identified by MRN, date of birth, ID band Patient awake    Reviewed: Allergy & Precautions, NPO status , Patient's Chart, lab work & pertinent test results  History of Anesthesia Complications (+) PONV and history of anesthetic complications  Airway Mallampati: II  TM Distance: >3 FB Neck ROM: Full    Dental  (+) Poor Dentition   Pulmonary sleep apnea and Continuous Positive Airway Pressure Ventilation , former smoker   Pulmonary exam normal        Cardiovascular hypertension, negative cardio ROS Normal cardiovascular exam     Neuro/Psych  PSYCHIATRIC DISORDERS Anxiety Depression    negative neurological ROS     GI/Hepatic negative GI ROS,,,(+) Hepatitis -, B  Endo/Other  negative endocrine ROS    Renal/GU      Musculoskeletal   Abdominal   Peds  Hematology negative hematology ROS (+)   Anesthesia Other Findings Anxiety    Arthritis  hands  Cancer (HCC) 2019 skin  Hepatitis B 1972 hep b antigen negative 07/20/2016  PONV (postoperative nausea and vomiting)   Pre-diabetes    Sleep apnea  uses cpap  Vertigo 01/2017 off and on for about 6 months. Relieved by [facility].  Wears hearing aid in both ears       Reproductive/Obstetrics negative OB ROS                             Anesthesia Physical Anesthesia Plan  ASA: 2  Anesthesia Plan: General   Post-op Pain Management: Minimal or no pain anticipated   Induction: Intravenous  PONV Risk Score and Plan: 3 and Propofol  infusion, TIVA and Ondansetron   Airway Management Planned: Nasal Cannula  Additional Equipment: None  Intra-op Plan:   Post-operative Plan:   Informed Consent: I have reviewed the patients History and Physical, chart, labs and discussed the procedure including the risks, benefits and alternatives for the proposed anesthesia with the patient or authorized representative who  has indicated his/her understanding and acceptance.     Dental advisory given  Plan Discussed with: CRNA and Surgeon  Anesthesia Plan Comments: (Discussed risks of anesthesia with patient, including possibility of difficulty with spontaneous ventilation under anesthesia necessitating airway intervention, PONV, and rare risks such as cardiac or respiratory or neurological events, and allergic reactions. Discussed the role of CRNA in patient's perioperative care. Patient understands.)       Anesthesia Quick Evaluation

## 2023-05-27 NOTE — Anesthesia Postprocedure Evaluation (Signed)
 Anesthesia Post Note  Patient: Tammy Gould  Procedure(s) Performed: COLONOSCOPY POLYPECTOMY, INTESTINE CONTROL OF HEMORRHAGE, GI TRACT, ENDOSCOPIC, BY CLIPPING OR OVERSEWING  Patient location during evaluation: Endoscopy Anesthesia Type: General Level of consciousness: awake and alert Pain management: pain level controlled Vital Signs Assessment: post-procedure vital signs reviewed and stable Respiratory status: spontaneous breathing, nonlabored ventilation, respiratory function stable and patient connected to nasal cannula oxygen Cardiovascular status: stable and blood pressure returned to baseline Postop Assessment: no apparent nausea or vomiting Anesthetic complications: no  No notable events documented.   Last Vitals:  Vitals:   05/27/23 1100 05/27/23 1110  BP: 91/67 108/72  Pulse: 68 69  Resp: 19 14  Temp:    SpO2: 98% 97%    Last Pain:  Vitals:   05/27/23 1100  TempSrc:   PainSc: 0-No pain                 Enrique Harvest

## 2023-05-27 NOTE — Transfer of Care (Signed)
 Immediate Anesthesia Transfer of Care Note  Patient: Martie Slaughter Hart  Procedure(s) Performed: COLONOSCOPY POLYPECTOMY, INTESTINE CONTROL OF HEMORRHAGE, GI TRACT, ENDOSCOPIC, BY CLIPPING OR OVERSEWING  Patient Location: PACU  Anesthesia Type:General  Level of Consciousness: drowsy  Airway & Oxygen Therapy: Patient Spontanous Breathing  Post-op Assessment: Report given to RN and Post -op Vital signs reviewed and stable  Post vital signs: Reviewed and stable  Last Vitals:  Vitals Value Taken Time  BP 91/63 05/27/23 1053  Temp 36.6 C 05/27/23 1054  Pulse 73 05/27/23 1056  Resp 19 05/27/23 1056  SpO2 97 % 05/27/23 1056  Vitals shown include unfiled device data.  Last Pain:  Vitals:   05/27/23 1012  TempSrc: Tympanic  PainSc: 0-No pain         Complications: No notable events documented.

## 2023-05-27 NOTE — Plan of Care (Signed)

## 2023-05-27 NOTE — Care Management Obs Status (Signed)
 MEDICARE OBSERVATION STATUS NOTIFICATION   Patient Details  Name: ZYIAH WITHINGTON MRN: [id number] Date of Birth: [birth year]   Medicare Observation Status Notification Given:  Yes    Anise Kerns 05/27/2023, 11:46 AM

## 2023-05-27 NOTE — Assessment & Plan Note (Signed)
Anusol cream prescribed.

## 2023-05-28 ENCOUNTER — Encounter: Payer: Self-pay | Admitting: Gastroenterology

## 2023-05-30 ENCOUNTER — Ambulatory Visit: Payer: Self-pay | Admitting: Gastroenterology

## 2023-05-30 LAB — SURGICAL PATHOLOGY

## 2023-10-29 ENCOUNTER — Ambulatory Visit

## 2023-10-29 NOTE — Progress Notes (Deleted)
    Subjective   Tammy Gould is a 73 y.o. female who presents for the following: {Presents for:33384}. Patient is {New vs Established:33423}  Today patient reports: ***  Review of Systems:    {ROS:33436}.  The following portions of the chart were reviewed this encounter and updated as appropriate: medications, allergies, medical history  Relevant Medical History:  {Medical History:33437}  Objective  Well appearing patient in no apparent distress; mood and affect are within normal limits. Examination was performed of the: {Exam:33421}  Examination notable for: {Exam:33578}  Examination limited by: {Exam:33422}     Assessment & Plan   {Assessment & Plan:33425}   {Complexity:33336}  Procedures, orders, diagnosis for this visit:    There are no diagnoses linked to this encounter.  Return to clinic: No follow-ups on file.  ***  Documentation: I have reviewed the above documentation for accuracy and completeness, and I agree with the above.  Lauraine JAYSON Kanaris, MD

## 2023-10-29 NOTE — Patient Instructions (Signed)

## 2023-10-30 NOTE — Progress Notes (Signed)
 Pt arrived to clinic but stated she no longer needed appt and requested cancellation

## 2023-11-15 ENCOUNTER — Ambulatory Visit: Admitting: Podiatry

## 2023-11-15 DIAGNOSIS — L600 Ingrowing nail: Secondary | ICD-10-CM

## 2023-11-15 NOTE — Progress Notes (Signed)
 Subjective:  Patient ID: Tammy Gould, female    DOB: 1950-04-13,  MRN: [id number]  Chief Complaint  Patient presents with   Ingrown Toenail    73 y.o. female presents with the above complaint.  Patient presents with right hallux medial border ingrown painful to touch is progressing most recent shoe pressure would like to discuss treatment options.  Pain scale 7 out of 10 dull aching nature.  She would like to have removed.   Review of Systems: Negative except as noted in the HPI. Denies N/V/F/Ch.  Past Medical History:  Diagnosis Date   Arthritis    hands   Cervical disc disease 05/05/2013   Generalized anxiety disorder    Hearing loss    Has, does not wear.   History of COVID-19 02/2020   History of hepatitis B 1972   hep b antigen negative 07/20/2016   History of skin cancer 2019   Hyperglycemia 11/11/2013   Hyperlipidemia, mixed 05/24/2015   Hypertension    Lumbar disc disease 05/05/2013   Major depressive disorder 05/24/2015   OSA on CPAP 05/12/2014   12/18 study AHI 6, RDI 23   Ovarian mass, left 07/19/2016   PONV (postoperative nausea and vomiting)    Pre-diabetes    Recurrent incisional hernia 12/31/2020   Slow transit constipation 05/16/2018   Vertigo 01/2017   off and on for about 6 months.  Relieved by [facility].   Vitamin B12 deficiency 11/30/2016   149, 2018 B12 injections    Current Outpatient Medications:    alendronate (FOSAMAX) 70 MG tablet, Take 70 mg by mouth once a week., Disp: , Rfl:    ALPRAZolam  (XANAX ) 0.25 MG tablet, Take 0.25 mg by mouth daily as needed for anxiety., Disp: , Rfl:    baclofen (LIORESAL) 10 MG tablet, Take 1 tablet by mouth 3 (three) times daily., Disp: , Rfl:    cholecalciferol (VITAMIN D3) 25 MCG (1000 UT) tablet, Take 1,000 Units by mouth daily., Disp: , Rfl:    clindamycin  (CLEOCIN ) 150 MG capsule, Take 600 mg by mouth See admin instructions. Before dental procedures, Disp: , Rfl:    donepezil  (ARICEPT ) 5 MG tablet,  Take 5 mg by mouth at bedtime., Disp: , Rfl:    doxycycline  (VIBRA -TABS) 100 MG tablet, Take 1 tablet (100 mg total) by mouth 2 (two) times daily., Disp: 28 tablet, Rfl: 0   hydrocortisone  (ANUSOL -HC) 2.5 % rectal cream, Place rectally 2 (two) times daily., Disp: 30 g, Rfl: 0   meclizine  (ANTIVERT ) 12.5 MG tablet, Take 1 tablet (12.5 mg total) by mouth 3 (three) times daily as needed for dizziness., Disp: 30 tablet, Rfl: 0   ondansetron  (ZOFRAN -ODT) 4 MG disintegrating tablet, Take 1 tablet (4 mg total) by mouth every 8 (eight) hours as needed for nausea or vomiting., Disp: 20 tablet, Rfl: 0   vitamin B-12 (CYANOCOBALAMIN ) 1000 MCG tablet, Take 1,000 mcg by mouth daily., Disp: , Rfl:   Social History   Tobacco Use  Smoking Status Former   Current packs/day: 0.00   Types: Cigarettes   Quit date: 01/02/1986   Years since quitting: 37.9  Smokeless Tobacco Never    Allergies  Allergen Reactions   Vitamin E Dermatitis and Other (See Comments)    Other Reaction(s): Not available  alpha tocopherol   Penicillins Hives    Has patient had a PCN reaction causing immediate rash, facial/tongue/throat swelling, SOB or lightheadedness with hypotension: Yes Has patient had a PCN reaction causing severe rash involving mucus  membranes or skin necrosis: No Has patient had a PCN reaction that required hospitalization: No Has patient had a PCN reaction occurring within the last 10 years: No If all of the above answers are NO, then may proceed with Cephalosporin use.   Objective:  There were no vitals filed for this visit. There is no height or weight on file to calculate BMI. Constitutional Well developed. Well nourished.  Vascular Dorsalis pedis pulses palpable bilaterally. Posterior tibial pulses palpable bilaterally. Capillary refill normal to all digits.  No cyanosis or clubbing noted. Pedal hair growth normal.  Neurologic Normal speech. Oriented to person, place, and time. Epicritic  sensation to light touch grossly present bilaterally.  Dermatologic Painful ingrowing nail at medial nail borders of the hallux nail right. No other open wounds. No skin lesions.  Orthopedic: Normal joint ROM without pain or crepitus bilaterally. No visible deformities. No bony tenderness.   Radiographs: None Assessment:   1. Ingrown toenail of right foot    Plan:  Patient was evaluated and treated and all questions answered.  Ingrown Nail, right -Patient elects to proceed with minor surgery to remove ingrown toenail removal today. Consent reviewed and signed by patient. -Ingrown nail excised. See procedure note. -Educated on post-procedure care including soaking. Written instructions provided and reviewed. -Patient to follow up in 2 weeks for nail check.  Procedure: Excision of Ingrown Toenail Location: Right4 1st toe medial nail borders. Anesthesia: Lidocaine  1% plain; 1.5 mL and Marcaine  0.5% plain; 1.5 mL, digital block. Skin Prep: Betadine. Dressing: Silvadene; telfa; dry, sterile, compression dressing. Technique: Following skin prep, the toe was exsanguinated and a tourniquet was secured at the base of the toe. The affected nail border was freed, split with a nail splitter, and excised. Chemical matrixectomy was then performed with phenol and irrigated out with alcohol. The tourniquet was then removed and sterile dressing applied. Disposition: Patient tolerated procedure well. Patient to return in 2 weeks for follow-up.   No follow-ups on file.

## 2023-11-15 NOTE — Patient Instructions (Signed)

## 2023-11-19 ENCOUNTER — Telehealth: Payer: Self-pay | Admitting: Lab

## 2023-11-19 NOTE — Telephone Encounter (Signed)
 Patient calling requesting antibiotics called in procedure done last week showing signs of infection. Redness,swelling associated with pain warm to touch please advise.

## 2023-11-20 MED ORDER — DOXYCYCLINE HYCLATE 100 MG PO TABS: 100.0000 mg | ORAL_TABLET | Freq: Two times a day (BID) | ORAL | 0 refills | Status: AC

## 2023-11-20 NOTE — Addendum Note (Signed)
 Addended by: Nakema Fake on: 11/20/2023 08:40 AM   Modules accepted: Orders

## 2023-12-07 ENCOUNTER — Encounter: Payer: Self-pay | Admitting: General Surgery

## 2023-12-13 ENCOUNTER — Other Ambulatory Visit: Payer: Self-pay | Admitting: Internal Medicine

## 2023-12-13 DIAGNOSIS — Z1231 Encounter for screening mammogram for malignant neoplasm of breast: Secondary | ICD-10-CM

## 2024-01-18 ENCOUNTER — Encounter
